# Patient Record
Sex: Female | Born: 1960 | Race: Black or African American | Hispanic: No | Marital: Single | Smoking: Never smoker
Health system: Southern US, Community
[De-identification: ages and names within clinical notes are randomized; demographics above are authoritative.]

## PROBLEM LIST (undated history)

## (undated) DIAGNOSIS — R7303 Prediabetes: Secondary | ICD-10-CM

## (undated) HISTORY — DX: Prediabetes: R73.03

---

## 2016-11-22 ENCOUNTER — Ambulatory Visit: Payer: Self-pay | Admitting: Obstetrics & Gynecology

## 2017-06-19 ENCOUNTER — Emergency Department (HOSPITAL_COMMUNITY): Payer: BLUE CROSS/BLUE SHIELD

## 2017-06-19 ENCOUNTER — Emergency Department (HOSPITAL_COMMUNITY)
Admission: EM | Admit: 2017-06-19 | Discharge: 2017-06-19 | Disposition: A | Discharge status: Home / Self Care | Payer: BLUE CROSS/BLUE SHIELD | Attending: Emergency Medicine | Admitting: Emergency Medicine

## 2017-06-19 ENCOUNTER — Encounter (HOSPITAL_COMMUNITY): Payer: Self-pay | Admitting: *Deleted

## 2017-06-19 DIAGNOSIS — T148XXA Other injury of unspecified body region, initial encounter: Secondary | ICD-10-CM | POA: Insufficient documentation

## 2017-06-19 DIAGNOSIS — Y998 Other external cause status: Secondary | ICD-10-CM | POA: Insufficient documentation

## 2017-06-19 DIAGNOSIS — M542 Cervicalgia: Secondary | ICD-10-CM | POA: Diagnosis not present

## 2017-06-19 DIAGNOSIS — M545 Low back pain: Secondary | ICD-10-CM | POA: Insufficient documentation

## 2017-06-19 DIAGNOSIS — Y9389 Activity, other specified: Secondary | ICD-10-CM | POA: Diagnosis not present

## 2017-06-19 DIAGNOSIS — R52 Pain, unspecified: Secondary | ICD-10-CM

## 2017-06-19 DIAGNOSIS — Y929 Unspecified place or not applicable: Secondary | ICD-10-CM | POA: Diagnosis not present

## 2017-06-19 DIAGNOSIS — M79602 Pain in left arm: Secondary | ICD-10-CM | POA: Diagnosis not present

## 2017-06-19 DIAGNOSIS — M25512 Pain in left shoulder: Secondary | ICD-10-CM | POA: Diagnosis present

## 2017-06-19 MED ORDER — NAPROXEN 500 MG PO TABS: 500.0000 mg | ORAL_TABLET | Freq: Two times a day (BID) | ORAL | 0 refills | Status: DC

## 2017-06-19 MED ORDER — ACETAMINOPHEN 500 MG PO TABS
1000.0000 mg | ORAL_TABLET | Freq: Once | ORAL | Status: AC
  Administered 2017-06-19: 1000 mg via ORAL
  Filled 2017-06-19: qty 2

## 2017-06-19 MED ORDER — CYCLOBENZAPRINE HCL 5 MG PO TABS: 5.0000 mg | ORAL_TABLET | Freq: Two times a day (BID) | ORAL | 0 refills | Status: DC | PRN
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
## 2017-06-20 ENCOUNTER — Encounter: Payer: Self-pay | Admitting: Family Medicine

## 2017-06-20 ENCOUNTER — Ambulatory Visit (INDEPENDENT_AMBULATORY_CARE_PROVIDER_SITE_OTHER): Payer: Self-pay | Admitting: Family Medicine

## 2017-06-20 DIAGNOSIS — M545 Low back pain, unspecified: Secondary | ICD-10-CM

## 2017-06-20 DIAGNOSIS — M62830 Muscle spasm of back: Secondary | ICD-10-CM

## 2017-06-20 MED ORDER — METHOCARBAMOL 500 MG PO TABS: 500.0000 mg | ORAL_TABLET | Freq: Three times a day (TID) | ORAL | 0 refills | Status: DC | PRN

## 2017-06-20 MED ORDER — IBUPROFEN 600 MG PO TABS: 600.0000 mg | ORAL_TABLET | Freq: Three times a day (TID) | ORAL | 0 refills | Status: DC | PRN

## 2017-06-20 MED ORDER — KETOROLAC TROMETHAMINE 60 MG/2ML IM SOLN
60.0000 mg | Freq: Once | INTRAMUSCULAR | Status: AC
Start: 2017-06-20 — End: 2017-06-20
  Administered 2017-06-20: 60 mg via INTRAMUSCULAR
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
## 2017-06-25 ENCOUNTER — Encounter (HOSPITAL_COMMUNITY): Payer: Self-pay | Admitting: Emergency Medicine

## 2017-06-25 ENCOUNTER — Emergency Department (HOSPITAL_COMMUNITY): Payer: BLUE CROSS/BLUE SHIELD

## 2017-06-25 ENCOUNTER — Emergency Department (HOSPITAL_COMMUNITY)
Admission: EM | Admit: 2017-06-25 | Discharge: 2017-06-25 | Disposition: A | Discharge status: Home / Self Care | Payer: BLUE CROSS/BLUE SHIELD | Attending: Emergency Medicine | Admitting: Emergency Medicine

## 2017-06-25 DIAGNOSIS — Y999 Unspecified external cause status: Secondary | ICD-10-CM | POA: Insufficient documentation

## 2017-06-25 DIAGNOSIS — S39012D Strain of muscle, fascia and tendon of lower back, subsequent encounter: Secondary | ICD-10-CM

## 2017-06-25 DIAGNOSIS — S3992XA Unspecified injury of lower back, initial encounter: Secondary | ICD-10-CM | POA: Diagnosis present

## 2017-06-25 DIAGNOSIS — S39012A Strain of muscle, fascia and tendon of lower back, initial encounter: Secondary | ICD-10-CM | POA: Insufficient documentation

## 2017-06-25 DIAGNOSIS — Y92410 Unspecified street and highway as the place of occurrence of the external cause: Secondary | ICD-10-CM | POA: Insufficient documentation

## 2017-06-25 DIAGNOSIS — Y939 Activity, unspecified: Secondary | ICD-10-CM | POA: Insufficient documentation

## 2017-06-25 DIAGNOSIS — R109 Unspecified abdominal pain: Secondary | ICD-10-CM | POA: Insufficient documentation

## 2017-06-25 MED ORDER — MELOXICAM 15 MG PO TABS: 15.0000 mg | ORAL_TABLET | Freq: Every day | ORAL | 0 refills | Status: DC

## 2017-06-25 NOTE — ED Notes (Signed)
Patient transported to CT 

## 2017-06-25 NOTE — Discharge Instructions (Signed)
Try heating pads, rest.  Take Mobic once a day for pain and inflammation.  See exercises below with stretches.  Do those daily.  If not improving, follow-up with family doctor or return if worsening symptoms.
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
## 2017-09-23 ENCOUNTER — Ambulatory Visit: Payer: Self-pay | Admitting: Family Medicine

## 2019-10-30 ENCOUNTER — Other Ambulatory Visit: Payer: Self-pay

## 2019-10-30 ENCOUNTER — Telehealth: Payer: Self-pay

## 2019-10-30 DIAGNOSIS — Z1231 Encounter for screening mammogram for malignant neoplasm of breast: Secondary | ICD-10-CM

## 2019-10-30 NOTE — Telephone Encounter (Signed)
Left message on voicemail requesting patient to return call to office,  returning patient's call regarding [facility] inquiry.

## 2019-10-30 NOTE — Telephone Encounter (Signed)
Patient returned call and was scheduled for 11/19/2019 with the [facility].

## 2019-11-02 ENCOUNTER — Other Ambulatory Visit: Payer: Self-pay | Admitting: Obstetrics and Gynecology

## 2019-11-02 DIAGNOSIS — Z1231 Encounter for screening mammogram for malignant neoplasm of breast: Secondary | ICD-10-CM

## 2019-11-06 ENCOUNTER — Telehealth: Payer: Self-pay

## 2019-11-06 NOTE — Telephone Encounter (Signed)
Patient left voicemail requesting mammogram appointment information. Left message on identifying voicemail-Mammogram appointment is at [facility] on 11/19/2019 @ 1:30pm.

## 2019-11-19 ENCOUNTER — Encounter (INDEPENDENT_AMBULATORY_CARE_PROVIDER_SITE_OTHER): Payer: Self-pay

## 2019-11-19 ENCOUNTER — Ambulatory Visit: Payer: Self-pay | Admitting: Student

## 2019-11-19 ENCOUNTER — Other Ambulatory Visit: Payer: Self-pay

## 2019-11-19 ENCOUNTER — Encounter: Payer: Self-pay | Admitting: Student

## 2019-11-19 ENCOUNTER — Ambulatory Visit
Admission: RE | Admit: 2019-11-19 | Discharge: 2019-11-19 | Disposition: A | Discharge status: Home / Self Care | Payer: Self-pay | Source: Ambulatory Visit | Attending: Obstetrics and Gynecology | Admitting: Obstetrics and Gynecology

## 2019-11-19 VITALS — BP 156/88 | Temp 97.7°F | Wt 184.0 lb

## 2019-11-19 DIAGNOSIS — Z1231 Encounter for screening mammogram for malignant neoplasm of breast: Secondary | ICD-10-CM

## 2019-11-19 DIAGNOSIS — Z01419 Encounter for gynecological examination (general) (routine) without abnormal findings: Secondary | ICD-10-CM

## 2019-11-19 DIAGNOSIS — N644 Mastodynia: Secondary | ICD-10-CM

## 2019-11-19 NOTE — Progress Notes (Signed)
Angela Duran is a 59 y.o. No obstetric history on file. female who presents to [facility] clinic today with complaint of left breast pain. Intermittent pain for the last week that radiates to her left axilla.    Pap Smear: Pap smear completed today. Patient thinks her last pap smear was 2 years ago but is unsure & doesn't remember where she had it done. Reports it was normal. Per patient has no history of an abnormal Pap smear. Last Pap smear result is not available in Epic.   Physical exam: Breasts Breasts symmetrical. No skin abnormalities bilateral breasts. No nipple retraction bilateral breasts. No nipple discharge bilateral breasts. No lymphadenopathy. No lumps palpated bilateral breasts.       Pelvic/Bimanual Ext Genitalia No lesions, no swelling and no discharge observed on external genitalia.        Vagina Vagina pink and normal texture. No lesions or discharge observed in vagina.        Cervix Cervix is present. Cervix pink and of normal texture. No discharge observed.    Uterus Uterus is present and palpable. Uterus in normal position and normal size.        Adnexae Bilateral ovaries present and palpable. No tenderness on palpation.         Rectovaginal No rectal exam completed today since patient had no rectal complaints. No skin abnormalities observed on exam.     Smoking History: Patient has never smoked.    Patient Navigation: Patient education provided. Access to services provided for patient through B[facility]CP program.    Colorectal Cancer Screening: Per patient has never had colonoscopy completed No complaints today.    Breast and Cervical Cancer Risk Assessment: Patient does not have family history of breast cancer, known genetic mutations, or radiation treatment to the chest before age 77. Patient does not have history of cervical dysplasia, immunocompromised, or DES exposure in-utero.  Risk Assessment    Risk Scores      11/19/2019   Last edited by: Narda Rutherford, LPN   5-year risk: 1.4 %   Lifetime risk: 7.1 %          A: B[facility]CP exam with pap smear Complaint of left breast pain  P: Referred [facility] to [facility] for a screening mammogram. Appointment scheduled today.  Angela Horn, NP 11/19/2019 11:02 AM

## 2019-11-23 ENCOUNTER — Telehealth: Payer: Self-pay

## 2019-11-23 NOTE — Telephone Encounter (Signed)
Normal pap result letter mailed.  °

## 2019-11-25 LAB — CYTOLOGY - PAP
Adequacy: ABSENT
Comment: NEGATIVE
Diagnosis: NEGATIVE
High risk HPV: NEGATIVE

## 2020-04-25 ENCOUNTER — Other Ambulatory Visit: Payer: Self-pay

## 2020-04-25 ENCOUNTER — Ambulatory Visit
Admission: EM | Admit: 2020-04-25 | Discharge: 2020-04-25 | Disposition: A | Discharge status: Home / Self Care | Payer: 59 | Attending: Physician Assistant | Admitting: Physician Assistant

## 2020-04-25 DIAGNOSIS — R059 Cough, unspecified: Secondary | ICD-10-CM

## 2020-04-25 DIAGNOSIS — R509 Fever, unspecified: Secondary | ICD-10-CM

## 2020-04-25 DIAGNOSIS — R52 Pain, unspecified: Secondary | ICD-10-CM | POA: Diagnosis not present

## 2020-04-25 DIAGNOSIS — R05 Cough: Secondary | ICD-10-CM

## 2020-04-25 DIAGNOSIS — Z1152 Encounter for screening for COVID-19: Secondary | ICD-10-CM

## 2020-04-25 MED ORDER — BENZONATATE 100 MG PO CAPS: 100.0000 mg | ORAL_CAPSULE | Freq: Three times a day (TID) | ORAL | 0 refills | Status: DC | PRN

## 2020-04-25 NOTE — ED Triage Notes (Signed)
Pt c/o cough, sore throat, nasal congestion, body aches, chills, headaches, and n/v/d since yesterday.

## 2020-04-25 NOTE — Discharge Instructions (Addendum)
Recommend start Tessalon cough pills 1 every 8 hours as needed. Continue to push fluids to keep well hydrated and to help loosen up mucus in sinuses and chest. Rest. Continue Tylenol 1000mg  and alternate with Ibuprofen 600mg  every 4 hours as needed for fever and body aches. Follow-up pending COVID 19 test results.

## 2020-04-25 NOTE — ED Provider Notes (Signed)
[name]    CSN: [id number] Arrival date & time: 04/25/20  1034      History   Chief Complaint Chief Complaint  Patient presents with   Cough    HPI Angela Duran is a 59 y.o. female.   59 year old female presents with cough, body aches, chills, headache, nasal congestion, sore throat, feeling weak with nausea, vomiting and diarrhea that started yesterday. She has taken OTC cold medication with minimal relief. No known exposure to COVID 19. Has not been vaccinated against COVID 19. Keeping a little fluids down. No other chronic health issues. Takes no daily medication.   The history is provided by the patient.    Past Medical History:  Diagnosis Date   Prediabetes     There are no problems to display for this patient.   Past Surgical History:  Procedure Laterality Date   CESAREAN SECTION      OB History   No obstetric history on file.      Home Medications    Prior to Admission medications   Medication Sig Start Date End Date Taking? Authorizing Provider  benzonatate (TESSALON) 100 MG capsule Take 1 capsule (100 mg total) by mouth 3 (three) times daily as needed for cough. 04/25/20   AmyotAli Lowe, NP    Family History Family History  Problem Relation Age of Onset   Cerebral aneurysm Mother    Hypertension Father    Arthritis Father    Heart attack Sister    Seizures Sister    Heart attack Sister    Breast cancer Cousin     Social History Social History   Tobacco Use   Smoking status: Never Smoker   Smokeless tobacco: Never Used  Building services engineer Use: Never used  Substance Use Topics   Alcohol use: No   Drug use: No     Allergies   Patient has no known allergies.   Review of Systems Review of Systems  Constitutional: Positive for activity change, appetite change, chills, fatigue and fever.  HENT: Positive for congestion, postnasal drip, rhinorrhea, sinus pressure, sinus pain and sore throat.  Negative for ear discharge, ear pain, facial swelling, mouth sores, nosebleeds and trouble swallowing.   Eyes: Negative for pain, discharge, redness and itching.  Respiratory: Positive for cough. Negative for chest tightness, shortness of breath and wheezing.   Gastrointestinal: Positive for diarrhea, nausea and vomiting.  Genitourinary: Negative for decreased urine volume, difficulty urinating and dysuria.  Musculoskeletal: Positive for arthralgias and myalgias. Negative for neck pain and neck stiffness.  Skin: Negative for color change, rash and wound.  Allergic/Immunologic: Negative for environmental allergies, food allergies and immunocompromised state.  Neurological: Positive for weakness and headaches. Negative for dizziness, seizures, syncope, facial asymmetry, light-headedness and numbness.  Hematological: Negative for adenopathy. Does not bruise/bleed easily.     Physical Exam Triage Vital Signs ED Triage Vitals  Enc Vitals Group     BP --      Pulse Rate 04/25/20 1134 100     Resp 04/25/20 1134 18     Temp 04/25/20 1134 100.2 F (37.9 C)     Temp Source 04/25/20 1134 Oral     SpO2 04/25/20 1134 94 %     Weight --      Height --      Head Circumference --      Peak Flow --      Pain Score 04/25/20 1135 8     Pain  Loc --      Pain Edu? --      Excl. in GC? --    No data found.  Updated Vital Signs Pulse 100    Temp 100.2 F (37.9 C) (Oral)    Resp 18    SpO2 94%   Visual Acuity Right Eye Distance:   Left Eye Distance:   Bilateral Distance:    Right Eye Near:   Left Eye Near:    Bilateral Near:     Physical Exam Vitals and nursing note reviewed.  Constitutional:      General: She is awake. She is not in acute distress.    Appearance: She is well-developed and well-groomed. She is ill-appearing and diaphoretic (sweating but chills).  HENT:     Head: Normocephalic and atraumatic.     Right Ear: Hearing, tympanic membrane, ear canal and external ear normal.       Left Ear: Hearing, tympanic membrane, ear canal and external ear normal.     Nose: Congestion present.     Right Sinus: Frontal sinus tenderness present. No maxillary sinus tenderness.     Left Sinus: Frontal sinus tenderness present. No maxillary sinus tenderness.     Mouth/Throat:     Lips: Pink.     Mouth: Mucous membranes are moist.     Pharynx: Oropharynx is clear. Uvula midline. Posterior oropharyngeal erythema present. No pharyngeal swelling, oropharyngeal exudate or uvula swelling.  Eyes:     Extraocular Movements: Extraocular movements intact.     Conjunctiva/sclera: Conjunctivae normal.  Cardiovascular:     Rate and Rhythm: Regular rhythm. Tachycardia present.     Heart sounds: Normal heart sounds. No murmur heard.   Pulmonary:     Effort: Pulmonary effort is normal. No tachypnea, respiratory distress or retractions.     Breath sounds: Normal breath sounds and air entry. No decreased air movement. No decreased breath sounds, wheezing, rhonchi or rales.  Musculoskeletal:        General: Normal range of motion.     Cervical back: Normal range of motion and neck supple. No rigidity.  Lymphadenopathy:     Cervical: No cervical adenopathy.  Skin:    General: Skin is warm and moist.     Capillary Refill: Capillary refill takes less than 2 seconds.     Findings: No rash.  Neurological:     General: No focal deficit present.     Mental Status: She is alert and oriented to person, place, and time.  Psychiatric:        Mood and Affect: Mood normal.        Behavior: Behavior normal. Behavior is cooperative.        Thought Content: Thought content normal.        Judgment: Judgment normal.      UC Treatments / Results  Labs (all labs ordered are listed, but only abnormal results are displayed) Labs Reviewed  NOVEL CORONAVIRUS, NAA    EKG   Radiology No results found.  Procedures Procedures (including critical care time)  Medications Ordered in UC Medications  - No data to display  Initial Impression / Assessment and Plan / UC Course  I have reviewed the triage vital signs and the nursing notes.  Pertinent labs & imaging results that were available during my care of the patient were reviewed by me and considered in my medical decision making (see chart for details).    Reviewed with patient that she probably has a viral illness- possible  COVID 19. Recommend take Tessalon cough pills 1 every 8 hours as needed. Continue to push fluids to help loosen up any mucus in sinuses and chest. Rest. Stay at home. Continue Tylenol 1000mg  and alternate with Ibuprofen 600mg  every 4 hours as needed for fever and body aches. Follow-up pending COVID 19 test results.   Final Clinical Impressions(s) / UC Diagnoses   Final diagnoses:  Encounter for screening for COVID-19  Generalized body aches  Chills with fever  Cough     Discharge Instructions     Recommend start Tessalon cough pills 1 every 8 hours as needed. Continue to push fluids to keep well hydrated and to help loosen up mucus in sinuses and chest. Rest. Continue Tylenol 1000mg  and alternate with Ibuprofen 600mg  every 4 hours as needed for fever and body aches. Follow-up pending COVID 19 test results.     ED Prescriptions    Medication Sig Dispense Auth. Provider   benzonatate (TESSALON) 100 MG capsule Take 1 capsule (100 mg total) by mouth 3 (three) times daily as needed for cough. 21 capsule Johniya Durfee, , NP     PDMP not reviewed this encounter.   , NP 04/25/20 1736

## 2020-04-27 ENCOUNTER — Telehealth: Payer: Self-pay | Admitting: Family Medicine

## 2020-04-27 LAB — SARS-COV-2, NAA 2 DAY TAT

## 2020-04-27 LAB — NOVEL CORONAVIRUS, NAA: SARS-CoV-2, NAA: NOT DETECTED

## 2020-04-27 NOTE — Telephone Encounter (Signed)
Returned call to patient and LVM to return call and schedule an appt.    Copied from CRM [id number]. Topic: General - Other >> Apr 27, 2020  8:53 AM Jaquita Rector A wrote: Reason for CRM: Patient called in to schedule an appointment with Dr Delford Field she have never seen him before here at this location. She would like a call back today if there is a way she can be seen. Complains of flu like symptoms had a covid test that came back negative but still feeling sick and would like to be seen today if possible. Please call Ph# [phone]

## 2020-04-30 ENCOUNTER — Emergency Department (HOSPITAL_COMMUNITY)
Admission: EM | Admit: 2020-04-30 | Discharge: 2020-05-01 | Disposition: A | Discharge status: Home / Self Care | Payer: 59 | Attending: Emergency Medicine | Admitting: Emergency Medicine

## 2020-04-30 ENCOUNTER — Emergency Department (HOSPITAL_COMMUNITY): Payer: 59

## 2020-04-30 ENCOUNTER — Encounter (HOSPITAL_COMMUNITY): Payer: Self-pay | Admitting: Emergency Medicine

## 2020-04-30 ENCOUNTER — Other Ambulatory Visit: Payer: Self-pay

## 2020-04-30 DIAGNOSIS — R112 Nausea with vomiting, unspecified: Secondary | ICD-10-CM | POA: Diagnosis not present

## 2020-04-30 DIAGNOSIS — U071 COVID-19: Secondary | ICD-10-CM | POA: Diagnosis not present

## 2020-04-30 DIAGNOSIS — R05 Cough: Secondary | ICD-10-CM | POA: Diagnosis present

## 2020-04-30 NOTE — ED Triage Notes (Signed)
Pt st's she has Covid and c/o shortness of breath, nausea and vomiting   St's unable to keep anything down.  Also c/o diarrhea

## 2020-05-01 LAB — SARS CORONAVIRUS 2 BY RT PCR (HOSPITAL ORDER, PERFORMED IN ~~LOC~~ HOSPITAL LAB): SARS Coronavirus 2: POSITIVE — AB

## 2020-05-01 MED ORDER — METOCLOPRAMIDE HCL 5 MG/ML IJ SOLN
10.0000 mg | Freq: Once | INTRAMUSCULAR | Status: AC
  Administered 2020-05-01: 10 mg via INTRAVENOUS
  Filled 2020-05-01: qty 2

## 2020-05-01 MED ORDER — LACTATED RINGERS IV BOLUS
1000.0000 mL | Freq: Once | INTRAVENOUS | Status: AC
  Administered 2020-05-01: 1000 mL via INTRAVENOUS

## 2020-05-01 MED ORDER — METOCLOPRAMIDE HCL 10 MG PO TABS: 10.0000 mg | ORAL_TABLET | Freq: Four times a day (QID) | ORAL | 0 refills | Status: DC | PRN

## 2020-05-01 NOTE — ED Notes (Signed)
IV attempted x2- unsuccessful. 

## 2020-05-01 NOTE — ED Provider Notes (Signed)
[facility] EMERGENCY DEPARTMENT Provider Note   CSN: [id number] Arrival date & time: 04/30/20  2112     History Chief Complaint  Patient presents with  . Covid    Angela Duran is a 59 y.o. female.   Emesis Severity:  Mild Duration:  3 days Timing:  Constant Quality:  Stomach contents Able to tolerate:  Liquids Progression:  Unchanged Chronicity:  New Recent urination:  Normal Relieved by:  Nothing Worsened by:  Nothing Ineffective treatments:  None tried Associated symptoms: abdominal pain, chills, cough, diarrhea, headaches, myalgias and sore throat        Past Medical History:  Diagnosis Date  . Prediabetes     There are no problems to display for this patient.   Past Surgical History:  Procedure Laterality Date  . CESAREAN SECTION       OB History   No obstetric history on file.     Family History  Problem Relation Age of Onset  . Cerebral aneurysm Mother   . Hypertension Father   . Arthritis Father   . Heart attack Sister   . Seizures Sister   . Heart attack Sister   . Breast cancer Cousin     Social History   Tobacco Use  . Smoking status: Never Smoker  . Smokeless tobacco: Never Used  Vaping Use  . Vaping Use: Never used  Substance Use Topics  . Alcohol use: No  . Drug use: No    Home Medications Prior to Admission medications   Medication Sig Start Date End Date Taking? Authorizing Provider  benzonatate (TESSALON) 100 MG capsule Take 1 capsule (100 mg total) by mouth 3 (three) times daily as needed for cough. 04/25/20   Sudie Grumbling, NP  metoCLOPramide (REGLAN) 10 MG tablet Take 1 tablet (10 mg total) by mouth every 6 (six) hours as needed for nausea (nausea/headache). 05/01/20   Amarra Sawyer, Barbara Cower, MD    Allergies    Patient has no known allergies.  Review of Systems   Review of Systems  Constitutional: Positive for chills.  HENT: Positive for sore throat.   Respiratory: Positive for cough.     Gastrointestinal: Positive for abdominal pain, diarrhea and vomiting.  Musculoskeletal: Positive for myalgias.  Neurological: Positive for headaches.  All other systems reviewed and are negative.   Physical Exam Updated Vital Signs BP (!) 148/93 (BP Location: Right Arm)   Pulse 98   Temp 98.1 F (36.7 C) (Oral)   Resp 16   Ht 5\' 4"  (1.626 m)   Wt 81.6 kg   SpO2 95%   BMI 30.90 kg/m   Physical Exam Vitals and nursing note reviewed.  Constitutional:      Appearance: She is well-developed.  HENT:     Head: Normocephalic and atraumatic.     Mouth/Throat:     Mouth: Mucous membranes are moist.     Pharynx: Oropharynx is clear.  Eyes:     Pupils: Pupils are equal, round, and reactive to light.  Cardiovascular:     Rate and Rhythm: Normal rate and regular rhythm.  Pulmonary:     Effort: No respiratory distress.     Breath sounds: No stridor.  Abdominal:     General: Abdomen is flat. There is no distension.  Musculoskeletal:     Cervical back: Normal range of motion.  Skin:    General: Skin is warm and dry.  Neurological:     General: No focal deficit present.  Mental Status: She is alert.     ED Results / Procedures / Treatments   Labs (all labs ordered are listed, but only abnormal results are displayed) Labs Reviewed  SARS CORONAVIRUS 2 BY RT PCR (HOSPITAL ORDER, PERFORMED IN [facility]) - Abnormal; Notable for the following components:      Result Value   SARS Coronavirus 2 POSITIVE (*)    All other components within normal limits  CBC WITH DIFFERENTIAL/PLATELET  COMPREHENSIVE METABOLIC PANEL    EKG None  Radiology DG Chest Portable 1 View  Result Date: 04/30/2020 CLINICAL DATA:  Cough. COVID positive. EXAM: PORTABLE CHEST 1 VIEW COMPARISON:  None. FINDINGS: Very mild, hazy atelectasis and/or early infiltrate is seen within the bilateral lung bases. There is no evidence of a pleural effusion or pneumothorax. The heart size and  mediastinal contours are within normal limits. The visualized skeletal structures are unremarkable. IMPRESSION: Very mild, hazy bibasilar atelectasis and/or early infiltrate. Electronically Signed   By: Aram Candela M.D.   On: 04/30/2020 22:37    Procedures Procedures (including critical care time)  Medications Ordered in ED Medications  lactated ringers bolus 1,000 mL (0 mLs Intravenous Stopped 05/01/20 0635)  metoCLOPramide (REGLAN) injection 10 mg (10 mg Intravenous Given 05/01/20 0353)    ED Course  I have reviewed the triage vital signs and the nursing notes.  Pertinent labs & imaging results that were available during my care of the patient were reviewed by me and considered in my medical decision making (see chart for details).    MDM Rules/Calculators/A&P                          COVID positive.  Mostly GI symptoms. Doesn't want MAB infusion. Symptoms improved with fluids and reglan, dc on same. Return precautions discussed.   Final Clinical Impression(s) / ED Diagnoses Final diagnoses:  COVID-19  Non-intractable vomiting with nausea, unspecified vomiting type    Rx / DC Orders ED Discharge Orders         Ordered    metoCLOPramide (REGLAN) 10 MG tablet  Every 6 hours PRN        05/01/20 0631           Atlee Kluth, Barbara Cower, MD 05/02/20 9983

## 2020-05-11 ENCOUNTER — Inpatient Hospital Stay (HOSPITAL_COMMUNITY)
Admission: EM | Admit: 2020-05-11 | Discharge: 2020-05-16 | DRG: 193 | Disposition: A | Discharge status: Home / Self Care | Payer: 59 | Attending: Internal Medicine | Admitting: Internal Medicine

## 2020-05-11 ENCOUNTER — Encounter (HOSPITAL_COMMUNITY): Payer: Self-pay

## 2020-05-11 ENCOUNTER — Emergency Department (HOSPITAL_COMMUNITY): Payer: 59

## 2020-05-11 ENCOUNTER — Other Ambulatory Visit: Payer: Self-pay

## 2020-05-11 DIAGNOSIS — J1289 Other viral pneumonia: Principal | ICD-10-CM | POA: Diagnosis present

## 2020-05-11 DIAGNOSIS — U071 COVID-19: Secondary | ICD-10-CM | POA: Diagnosis present

## 2020-05-11 DIAGNOSIS — Z9119 Patient's noncompliance with other medical treatment and regimen: Secondary | ICD-10-CM

## 2020-05-11 DIAGNOSIS — J9601 Acute respiratory failure with hypoxia: Secondary | ICD-10-CM | POA: Diagnosis not present

## 2020-05-11 DIAGNOSIS — U099 Post covid-19 condition, unspecified: Secondary | ICD-10-CM | POA: Diagnosis present

## 2020-05-11 DIAGNOSIS — Z8249 Family history of ischemic heart disease and other diseases of the circulatory system: Secondary | ICD-10-CM

## 2020-05-11 DIAGNOSIS — R7989 Other specified abnormal findings of blood chemistry: Secondary | ICD-10-CM | POA: Diagnosis present

## 2020-05-11 DIAGNOSIS — Z803 Family history of malignant neoplasm of breast: Secondary | ICD-10-CM

## 2020-05-11 DIAGNOSIS — Z8261 Family history of arthritis: Secondary | ICD-10-CM

## 2020-05-11 DIAGNOSIS — R7401 Elevation of levels of liver transaminase levels: Secondary | ICD-10-CM | POA: Diagnosis present

## 2020-05-11 DIAGNOSIS — Z20822 Contact with and (suspected) exposure to covid-19: Secondary | ICD-10-CM | POA: Diagnosis present

## 2020-05-11 DIAGNOSIS — J1282 Pneumonia due to coronavirus disease 2019: Secondary | ICD-10-CM | POA: Diagnosis present

## 2020-05-11 DIAGNOSIS — R7303 Prediabetes: Secondary | ICD-10-CM | POA: Diagnosis present

## 2020-05-11 DIAGNOSIS — I10 Essential (primary) hypertension: Secondary | ICD-10-CM | POA: Diagnosis present

## 2020-05-11 NOTE — ED Triage Notes (Signed)
Patient had covid several weeks ago and today when she went to MD for further evaluation they noted sats remaining 90 and below. Patient complains of CP intermittently and had negative retest today for Covid, complains of exertional SOB. sats 99% room air on arrival

## 2020-05-11 NOTE — ED Notes (Signed)
Pt asked for oxygen. RN was notified and confirmed to give pt oxygen

## 2020-05-12 ENCOUNTER — Emergency Department (HOSPITAL_COMMUNITY): Payer: 59

## 2020-05-12 DIAGNOSIS — R7989 Other specified abnormal findings of blood chemistry: Secondary | ICD-10-CM | POA: Diagnosis present

## 2020-05-12 DIAGNOSIS — J1282 Pneumonia due to coronavirus disease 2019: Secondary | ICD-10-CM | POA: Diagnosis not present

## 2020-05-12 DIAGNOSIS — I10 Essential (primary) hypertension: Secondary | ICD-10-CM | POA: Diagnosis present

## 2020-05-12 DIAGNOSIS — J1289 Other viral pneumonia: Secondary | ICD-10-CM | POA: Diagnosis present

## 2020-05-12 DIAGNOSIS — Z20822 Contact with and (suspected) exposure to covid-19: Secondary | ICD-10-CM | POA: Diagnosis present

## 2020-05-12 DIAGNOSIS — Z9119 Patient's noncompliance with other medical treatment and regimen: Secondary | ICD-10-CM | POA: Diagnosis not present

## 2020-05-12 DIAGNOSIS — U071 COVID-19: Secondary | ICD-10-CM | POA: Diagnosis present

## 2020-05-12 DIAGNOSIS — U099 Post covid-19 condition, unspecified: Secondary | ICD-10-CM | POA: Diagnosis present

## 2020-05-12 DIAGNOSIS — J9601 Acute respiratory failure with hypoxia: Secondary | ICD-10-CM | POA: Diagnosis present

## 2020-05-12 DIAGNOSIS — Z803 Family history of malignant neoplasm of breast: Secondary | ICD-10-CM | POA: Diagnosis not present

## 2020-05-12 DIAGNOSIS — R7401 Elevation of levels of liver transaminase levels: Secondary | ICD-10-CM | POA: Diagnosis present

## 2020-05-12 DIAGNOSIS — Z8249 Family history of ischemic heart disease and other diseases of the circulatory system: Secondary | ICD-10-CM | POA: Diagnosis not present

## 2020-05-12 DIAGNOSIS — Z8261 Family history of arthritis: Secondary | ICD-10-CM | POA: Diagnosis not present

## 2020-05-12 DIAGNOSIS — R7303 Prediabetes: Secondary | ICD-10-CM | POA: Diagnosis present

## 2020-05-12 LAB — CBC
HCT: 37.7 % (ref 36.0–46.0)
Hemoglobin: 11.4 g/dL — ABNORMAL LOW (ref 12.0–15.0)
MCH: 26.3 pg (ref 26.0–34.0)
MCHC: 30.2 g/dL (ref 30.0–36.0)
MCV: 86.9 fL (ref 80.0–100.0)
Platelets: 251 10*3/uL (ref 150–400)
RBC: 4.34 MIL/uL (ref 3.87–5.11)
RDW: 12.3 % (ref 11.5–15.5)
WBC: 8.7 10*3/uL (ref 4.0–10.5)
nRBC: 0 % (ref 0.0–0.2)

## 2020-05-12 LAB — COMPREHENSIVE METABOLIC PANEL
ALT: 66 U/L — ABNORMAL HIGH (ref 0–44)
AST: 53 U/L — ABNORMAL HIGH (ref 15–41)
Albumin: 2.8 g/dL — ABNORMAL LOW (ref 3.5–5.0)
Alkaline Phosphatase: 94 U/L (ref 38–126)
Anion gap: 11 (ref 5–15)
BUN: 9 mg/dL (ref 6–20)
CO2: 25 mmol/L (ref 22–32)
Calcium: 8.8 mg/dL — ABNORMAL LOW (ref 8.9–10.3)
Chloride: 102 mmol/L (ref 98–111)
Creatinine, Ser: 0.64 mg/dL (ref 0.44–1.00)
GFR calc Af Amer: >60 mL/min (ref >60)
GFR calc non Af Amer: >60 mL/min (ref >60)
Glucose, Bld: 96 mg/dL (ref 70–99)
Potassium: 3.7 mmol/L (ref 3.5–5.1)
Sodium: 138 mmol/L (ref 135–145)
Total Bilirubin: 0.9 mg/dL (ref 0.3–1.2)
Total Protein: 7.5 g/dL (ref 6.5–8.1)

## 2020-05-12 LAB — TRIGLYCERIDES: Triglycerides: 262 mg/dL — ABNORMAL HIGH (ref <150)

## 2020-05-12 LAB — LACTATE DEHYDROGENASE: LDH: 376 U/L — ABNORMAL HIGH (ref 98–192)

## 2020-05-12 LAB — PROCALCITONIN: Procalcitonin: 0.14 ng/mL

## 2020-05-12 LAB — TROPONIN I (HIGH SENSITIVITY)
Troponin I (High Sensitivity): 33 ng/L — ABNORMAL HIGH (ref <18)
Troponin I (High Sensitivity): 27 ng/L — ABNORMAL HIGH (ref <18)

## 2020-05-12 LAB — D-DIMER, QUANTITATIVE: D-Dimer, Quant: 14.68 ug/mL-FEU — ABNORMAL HIGH (ref 0.00–0.50)

## 2020-05-12 LAB — FERRITIN: Ferritin: 788 ng/mL — ABNORMAL HIGH (ref 11–307)

## 2020-05-12 LAB — BRAIN NATRIURETIC PEPTIDE: B Natriuretic Peptide: 36.5 pg/mL (ref 0.0–100.0)

## 2020-05-12 LAB — C-REACTIVE PROTEIN: CRP: 2.4 mg/dL — ABNORMAL HIGH (ref <1.0)

## 2020-05-12 LAB — FIBRINOGEN: Fibrinogen: 408 mg/dL (ref 210–475)

## 2020-05-12 LAB — LACTIC ACID, PLASMA: Lactic Acid, Venous: 1.3 mmol/L (ref 0.5–1.9)

## 2020-05-12 MED ORDER — ZINC SULFATE 220 (50 ZN) MG PO CAPS
220.0000 mg | ORAL_CAPSULE | Freq: Every day | ORAL | Status: DC
  Administered 2020-05-12 – 2020-05-16 (×5): 220 mg via ORAL
  Filled 2020-05-12 (×5): qty 1

## 2020-05-12 MED ORDER — IOHEXOL 350 MG/ML SOLN
100.0000 mL | Freq: Once | INTRAVENOUS | Status: AC | PRN
  Administered 2020-05-12: 60 mL via INTRAVENOUS

## 2020-05-12 MED ORDER — METHYLPREDNISOLONE SODIUM SUCC 125 MG IJ SOLR
1.0000 mg/kg | Freq: Two times a day (BID) | INTRAMUSCULAR | Status: DC
  Administered 2020-05-12 – 2020-05-13 (×2): 81.875 mg via INTRAVENOUS
  Filled 2020-05-12 (×2): qty 2

## 2020-05-12 MED ORDER — ONDANSETRON HCL 4 MG/2ML IJ SOLN: 4.0000 mg | Freq: Four times a day (QID) | INTRAMUSCULAR | Status: DC | PRN

## 2020-05-12 MED ORDER — ONDANSETRON HCL 4 MG PO TABS: 4.0000 mg | ORAL_TABLET | Freq: Four times a day (QID) | ORAL | Status: DC | PRN

## 2020-05-12 MED ORDER — ENOXAPARIN SODIUM 40 MG/0.4ML ~~LOC~~ SOLN
40.0000 mg | SUBCUTANEOUS | Status: DC
  Administered 2020-05-12: 40 mg via SUBCUTANEOUS
  Filled 2020-05-12: qty 0.4

## 2020-05-12 MED ORDER — AEROCHAMBER PLUS FLO-VU LARGE MISC
Status: AC
  Administered 2020-05-12: 1
  Filled 2020-05-12: qty 1

## 2020-05-12 MED ORDER — GUAIFENESIN-DM 100-10 MG/5ML PO SYRP: 10.0000 mL | ORAL_SOLUTION | ORAL | Status: DC | PRN

## 2020-05-12 MED ORDER — ACETAMINOPHEN 325 MG PO TABS: 650.0000 mg | ORAL_TABLET | Freq: Four times a day (QID) | ORAL | Status: DC | PRN

## 2020-05-12 MED ORDER — ASCORBIC ACID 500 MG PO TABS
500.0000 mg | ORAL_TABLET | Freq: Every day | ORAL | Status: DC
  Administered 2020-05-12 – 2020-05-16 (×5): 500 mg via ORAL
  Filled 2020-05-12 (×5): qty 1

## 2020-05-12 MED ORDER — SODIUM CHLORIDE 0.9 % IV SOLN
100.0000 mg | INTRAVENOUS | Status: AC
  Administered 2020-05-12: 100 mg via INTRAVENOUS
  Filled 2020-05-12 (×2): qty 20

## 2020-05-12 MED ORDER — DEXAMETHASONE SODIUM PHOSPHATE 10 MG/ML IJ SOLN
10.0000 mg | Freq: Once | INTRAMUSCULAR | Status: AC
  Administered 2020-05-12: 10 mg via INTRAVENOUS
  Filled 2020-05-12: qty 1

## 2020-05-12 MED ORDER — SODIUM CHLORIDE 0.9 % IV SOLN: 100.0000 mg | Freq: Every day | INTRAVENOUS | Status: DC

## 2020-05-12 MED ORDER — SODIUM CHLORIDE 0.9 % IV SOLN: 200.0000 mg | Freq: Once | INTRAVENOUS | Status: DC

## 2020-05-12 MED ORDER — SODIUM CHLORIDE 0.9 % IV SOLN
100.0000 mg | Freq: Every day | INTRAVENOUS | Status: DC
  Administered 2020-05-12: 100 mg via INTRAVENOUS
  Filled 2020-05-12: qty 20

## 2020-05-12 MED ORDER — PREDNISONE 20 MG PO TABS: 50.0000 mg | ORAL_TABLET | Freq: Every day | ORAL | Status: DC

## 2020-05-12 MED ORDER — ALBUTEROL SULFATE HFA 108 (90 BASE) MCG/ACT IN AERS
2.0000 | INHALATION_SPRAY | Freq: Once | RESPIRATORY_TRACT | Status: AC
  Administered 2020-05-12: 2 via RESPIRATORY_TRACT
  Filled 2020-05-12: qty 6.7

## 2020-05-12 MED ORDER — HYDROCOD POLST-CPM POLST ER 10-8 MG/5ML PO SUER: 5.0000 mL | Freq: Two times a day (BID) | ORAL | Status: DC | PRN

## 2020-05-12 NOTE — ED Provider Notes (Signed)
[facility] EMERGENCY DEPARTMENT Provider Note   CSN: [id number] Arrival date & time: 05/11/20  1332     History Chief Complaint  Patient presents with  . covid-, from md office    Angela Duran is a 59 y.o. female.  HPI   PT is a 59 y/o female who presents to the ED today for eval of low oxygen. States she was diagnosed with COVID 9/15. She started feeling SOB 9/17. States she was at there PCP office yesterday for COVID recheck and per the note from the office which the patient has at bedside her sats would not go above 90% on RA. Pt states she believes that her oxygen was in the 80s. She was sent here for further eval. States SOB has worsened in recent days and that she gets SOB even with her daily activities. Denies any persistent CP. She states she only has CP when she tries to take a deep breath. Denies cough, hemoptysis, BLE swelling. Denies persistent fevers/mylagias or other systemic complaints.  Past Medical History:  Diagnosis Date  . Prediabetes     Patient Active Problem List   Diagnosis Date Noted  . Pneumonia due to COVID-19 virus 05/12/2020  . Elevated LFTs 05/12/2020  . Elevated d-dimer 05/12/2020    Past Surgical History:  Procedure Laterality Date  . CESAREAN SECTION       OB History   No obstetric history on file.     Family History  Problem Relation Age of Onset  . Cerebral aneurysm Mother   . Hypertension Father   . Arthritis Father   . Heart attack Sister   . Seizures Sister   . Heart attack Sister   . Breast cancer Cousin     Social History   Tobacco Use  . Smoking status: Never Smoker  . Smokeless tobacco: Never Used  Vaping Use  . Vaping Use: Never used  Substance Use Topics  . Alcohol use: No  . Drug use: No    Home Medications Prior to Admission medications   Medication Sig Start Date End Date Taking? Authorizing Provider  benzonatate (TESSALON) 100 MG capsule Take 1 capsule (100 mg total) by mouth 3  (three) times daily as needed for cough. Patient not taking: Reported on 05/12/2020 04/25/20   Sudie Grumbling, NP  metoCLOPramide (REGLAN) 10 MG tablet Take 1 tablet (10 mg total) by mouth every 6 (six) hours as needed for nausea (nausea/headache). Patient not taking: Reported on 05/12/2020 05/01/20   Mesner, Barbara Cower, MD    Allergies    Patient has no known allergies.  Review of Systems   Review of Systems  Constitutional: Negative for chills and fever.  HENT: Negative for ear pain and sore throat.   Eyes: Negative for visual disturbance.  Respiratory: Positive for shortness of breath. Negative for cough.   Cardiovascular: Positive for chest pain (intermittent). Negative for palpitations and leg swelling.  Gastrointestinal: Negative for abdominal pain, constipation, diarrhea, nausea and vomiting.  Genitourinary: Negative for dysuria and hematuria.  Musculoskeletal: Negative for back pain.  Skin: Negative for rash.  Neurological: Negative for seizures and syncope.  All other systems reviewed and are negative.   Physical Exam Updated Vital Signs BP (!) 129/91   Pulse (!) 101   Temp 98.8 F (37.1 C) (Oral)   Resp (!) 27   Ht 5\' 4"  (1.626 m)   Wt 81.6 kg   SpO2 93%   BMI 30.90 kg/m   Physical Exam Vitals and  nursing note reviewed.  Constitutional:      General: She is not in acute distress.    Appearance: She is well-developed.  HENT:     Head: Normocephalic and atraumatic.  Eyes:     Conjunctiva/sclera: Conjunctivae normal.  Cardiovascular:     Rate and Rhythm: Normal rate and regular rhythm.     Pulses: Normal pulses.     Heart sounds: No murmur heard.   Pulmonary:     Effort: Pulmonary effort is normal. No respiratory distress.     Breath sounds: Examination of the right-lower field reveals rales. Examination of the left-lower field reveals rales. Rales present.  Abdominal:     General: Bowel sounds are normal.     Palpations: Abdomen is soft.     Tenderness: There  is no abdominal tenderness. There is no guarding or rebound.  Musculoskeletal:     Cervical back: Neck supple.  Skin:    General: Skin is warm and dry.  Neurological:     Mental Status: She is alert.     ED Results / Procedures / Treatments   Labs (all labs ordered are listed, but only abnormal results are displayed) Labs Reviewed  COMPREHENSIVE METABOLIC PANEL - Abnormal; Notable for the following components:      Result Value   Calcium 8.8 (*)    Albumin 2.8 (*)    AST 53 (*)    ALT 66 (*)    All other components within normal limits  D-DIMER, QUANTITATIVE (NOT AT [facility]) - Abnormal; Notable for the following components:   D-Dimer, Quant 14.68 (*)    All other components within normal limits  LACTATE DEHYDROGENASE - Abnormal; Notable for the following components:   LDH 376 (*)    All other components within normal limits  FERRITIN - Abnormal; Notable for the following components:   Ferritin 788 (*)    All other components within normal limits  C-REACTIVE PROTEIN - Abnormal; Notable for the following components:   CRP 2.4 (*)    All other components within normal limits  TRIGLYCERIDES - Abnormal; Notable for the following components:   Triglycerides 262 (*)    All other components within normal limits  CBC - Abnormal; Notable for the following components:   Hemoglobin 11.4 (*)    All other components within normal limits  TROPONIN I (HIGH SENSITIVITY) - Abnormal; Notable for the following components:   Troponin I (High Sensitivity) 33 (*)    All other components within normal limits  TROPONIN I (HIGH SENSITIVITY) - Abnormal; Notable for the following components:   Troponin I (High Sensitivity) 27 (*)    All other components within normal limits  CULTURE, BLOOD (ROUTINE X 2)  CULTURE, BLOOD (ROUTINE X 2)  LACTIC ACID, PLASMA  PROCALCITONIN  FIBRINOGEN  HIV ANTIBODY (ROUTINE TESTING W REFLEX)  CREATININE, SERUM  HEPATITIS B SURFACE ANTIGEN  BRAIN NATRIURETIC PEPTIDE   I-STAT BETA HCG BLOOD, ED ([facility], [facility], [facility] ONLY)    EKG None  EKG with heart rate 112, sinus tach, left axis deviation, inferior infarct, age undetermined, anterior infarct, age undetermined  Radiology DG Chest 1 View  Result Date: 05/11/2020 CLINICAL DATA:  Decreased oxygen saturation EXAM: CHEST  1 VIEW COMPARISON:  April 30, 2020 FINDINGS: Ill-defined airspace opacity is noted throughout the lungs bilaterally, increased from recent study. Heart size and pulmonary vascularity are normal. No adenopathy. No bone lesions. IMPRESSION: Multifocal airspace opacity, increased from most recent study. Suspect atypical organism pneumonia. Cardiac silhouette normal. No  adenopathy. Electronically Signed   By: Bretta Bang III M.D.   On: 05/11/2020 16:09   CT Angio Chest PE W and/or Wo Contrast  Result Date: 05/12/2020 CLINICAL DATA:  Recent COVID.  Hypoxia, high probability PE EXAM: CT ANGIOGRAPHY CHEST WITH CONTRAST TECHNIQUE: Multidetector CT imaging of the chest was performed using the standard protocol during bolus administration of intravenous contrast. Multiplanar CT image reconstructions and MIPs were obtained to evaluate the vascular anatomy. CONTRAST:  59mL OMNIPAQUE IOHEXOL 350 MG/ML SOLN COMPARISON:  CT 06/25/2017 FINDINGS: Cardiovascular: Heart size normal. No pericardial effusion. Satisfactory opacification of pulmonary arteries noted, and there is no evidence of pulmonary emboli. Adequate contrast opacification of the thoracic aorta with no evidence of dissection, aneurysm, or stenosis. There is classic 3-vessel brachiocephalic arch anatomy without proximal stenosis. Mediastinum/Nodes: No hilar or mediastinal adenopathy. Lungs/Pleura: No pleural effusion. No pneumothorax. Coarse peripheral airspace opacities throughout both lungs with some associated interstitial thickening, new since previous. Upper Abdomen: No acute findings Musculoskeletal: No chest wall abnormality. No acute or  significant osseous findings. Review of the MIP images confirms the above findings. IMPRESSION: 1. Negative for acute PE or thoracic aortic dissection. 2. Extensive pulmonary parenchymal changes consistent with organizing pneumonia, likely subacute COVID. Electronically Signed   By: Corlis Leak M.D.   On: 05/12/2020 15:58    Procedures Procedures (including critical care time)  Medications Ordered in ED Medications  enoxaparin (LOVENOX) injection 40 mg (has no administration in time range)  methylPREDNISolone sodium succinate (SOLU-MEDROL) 125 mg/2 mL injection 81.875 mg (has no administration in time range)    Followed by  predniSONE (DELTASONE) tablet 50 mg (has no administration in time range)  guaiFENesin-dextromethorphan (ROBITUSSIN DM) 100-10 MG/5ML syrup 10 mL (has no administration in time range)  chlorpheniramine-HYDROcodone (TUSSIONEX) 10-8 MG/5ML suspension 5 mL (has no administration in time range)  ascorbic acid (VITAMIN C) tablet 500 mg (has no administration in time range)  zinc sulfate capsule 220 mg (has no administration in time range)  acetaminophen (TYLENOL) tablet 650 mg (has no administration in time range)  ondansetron (ZOFRAN) tablet 4 mg (has no administration in time range)    Or  ondansetron (ZOFRAN) injection 4 mg (has no administration in time range)  remdesivir 100 mg in sodium chloride 0.9 % 100 mL IVPB (has no administration in time range)    Followed by  remdesivir 100 mg in sodium chloride 0.9 % 100 mL IVPB (has no administration in time range)  albuterol (VENTOLIN HFA) 108 (90 Base) MCG/ACT inhaler 2 puff (2 puffs Inhalation Given 05/12/20 1236)  dexamethasone (DECADRON) injection 10 mg (10 mg Intravenous Given 05/12/20 1234)  AeroChamber Plus Flo-Vu Large MISC (1 each  Given 05/12/20 1235)  iohexol (OMNIPAQUE) 350 MG/ML injection 100 mL (60 mLs Intravenous Contrast Given 05/12/20 1513)    ED Course  I have reviewed the triage vital signs and the nursing  notes.  Pertinent labs & imaging results that were available during my care of the patient were reviewed by me and considered in my medical decision making (see chart for details).    MDM Rules/Calculators/A&P                          59 year old female with recent history of Covid presenting for evaluation of hypoxia that was noted at her PCP office prior to arrival.  Patient satting in low 90s today at rest and placed on 2 L.  Nursing ambulated pt and she dropped  briefly in to the 80s. I also personally ambulated the patient and she became dyspneic to the point that she had to sit down on the bed. Her sats then dropped as low at 79% on RA with a perfect waveform.   Chest x-ray reviewed/interpreted-  Multifocal airspace opacity, increased from most recent study. Suspect atypical organism pneumonia. Cardiac silhouette normal. No adenopathy  Reviewed/interpreted labs CBC was cancelled by lab yesterday, pending on admission CMP with marginally elevated liver enzymes, low albumin at 2.8 Inflammatory markers elevated Procalcitonin negative making bacterial infection less likely Beta-hCG negative Lactic acid negative Blood cultures obtained  EKG with sinus tach, left axis deviation and anterior/anterior infarct that are age-indeterminate  CXR reviewed/interpreted -  Multifocal airspace opacity, increased from most recent study. Suspect atypical organism pneumonia. Cardiac silhouette normal. No adenopathy.  CTA chest reviewed/interpreted  - 1. Negative for acute PE or thoracic aortic dissection. 2. Extensive pulmonary parenchymal changes consistent with organizing pneumonia, likely subacute COVID.  4:22 PM CONSULT with Dr. Rulon AbidePawahni who accepts patient for admission.    Final Clinical Impression(s) / ED Diagnoses Final diagnoses:  Acute respiratory failure with hypoxia [facility](HCC)    Rx / DC Orders ED Discharge Orders    None       Karrie MeresCouture, Tira Lafferty S, PA-C 05/12/20 1657    Bethann BerkshireZammit,  Joseph, MD 05/15/20 [phone]

## 2020-05-12 NOTE — ED Notes (Signed)
Pt O2 dropped to 86% when ambulating in room. RR 32-38.  Placed on 2L O2. Current Spo2 95%.

## 2020-05-12 NOTE — ED Notes (Signed)
Pt refused bloodwork no con plaints noted at this time

## 2020-05-12 NOTE — ED Notes (Signed)
Pt states she is a hard stick, the IV team had to get her IV in her left [facility] because the right AC was no good. Pt has good veins in her hands but does not want to be stuck there. Pt states "can we just let the nurse draw it from the IV"? Tech told the pt she would inform the nurse

## 2020-05-12 NOTE — ED Notes (Signed)
Pt taken off O2. Currently at 95% spo2 at rest.

## 2020-05-12 NOTE — ED Notes (Addendum)
Pt refusing blood work. States " I had a bad experience in the past, they couldn't find my veins and kept sticking me. I told the phlebotomist here they had one try and they didn't get it so I don't want to be stuck again. No blood work."

## 2020-05-12 NOTE — ED Notes (Signed)
Pt states she doest need oxygen anymore

## 2020-05-12 NOTE — H&P (Signed)
History and Physical    Angela Duran RUE:[id number] DOB: 28-Aug-1960 DOA: 05/11/2020  PCP: Patient, No Pcp Per  Patient coming from: Home  I have personally briefly reviewed patient's old medical records in [facility]  Chief Complaint: Hypoxia, COVID-19 positive on 9/18  HPI: Angela Duran is a 59 y.o. female with no significant past medical history presents to emergency department for evaluation of low oxygen saturation.  Patient tells me that she diagnosed with COVID-19 on 9/18.  She went to urgent care yesterday for retesting of COVID-19 where she was noted to be hypoxic and was recommended to go to ED for further evaluation and management.  She did tested negative for COVID-19 yesterday.  She continues to have shortness of breath even at rest, has pleuritic chest pain as well.  She denies wheezing, fever, chills, nausea, vomiting, diarrhea, leg swelling, recent travel, orthopnea, PND, abdominal pain, urinary or bowel changes.  Reports some weakness and fatigue.  She does not have any medical problems and does not take any medications at home.  Denies smoking, alcohol, illicit drug use.  She is not vaccinated against COVID-19.  ED Course: Upon arrival to ED: Patient tachycardic, tachypneic, oxygen saturation drops when she walks.  She is afebrile.  CBC is pending.  Lactic acid: WNL, CMP shows AST of 53, ALT: 66, D-dimer: 14, procalcitonin: 0.14, CRP: 2.4, troponin, blood culture: Pending.  CT angio of chest came back negative for PE.  Shows extensive pulmonary parenchymal changes consistent with organizing pneumonia likely subacute Covid.  Patient received Decadron in ED.  [facility] hospitalist consulted for admission for Covid pneumonia.  Review of Systems: As per HPI otherwise negative.    Past Medical History:  Diagnosis Date  . Prediabetes     Past Surgical History:  Procedure Laterality Date  . CESAREAN SECTION       reports that she has never smoked. She has never  used smokeless tobacco. She reports that she does not drink alcohol and does not use drugs.  No Known Allergies  Family History  Problem Relation Age of Onset  . Cerebral aneurysm Mother   . Hypertension Father   . Arthritis Father   . Heart attack Sister   . Seizures Sister   . Heart attack Sister   . Breast cancer Cousin     Prior to Admission medications   Medication Sig Start Date End Date Taking? Authorizing Provider  benzonatate (TESSALON) 100 MG capsule Take 1 capsule (100 mg total) by mouth 3 (three) times daily as needed for cough. Patient not taking: Reported on 05/12/2020 04/25/20   Sudie Grumbling, NP  metoCLOPramide (REGLAN) 10 MG tablet Take 1 tablet (10 mg total) by mouth every 6 (six) hours as needed for nausea (nausea/headache). Patient not taking: Reported on 05/12/2020 05/01/20   Marily Memos, MD    Physical Exam: Vitals:   05/12/20 1330 05/12/20 1345 05/12/20 1400 05/12/20 1415  BP: (!) 155/87 (!) 146/87 (!) 154/90 (!) 129/91  Pulse: 94 97 98 99  Resp: (!) 27 (!) 24 20 (!) 27  Temp:      TempSrc:      SpO2: 92% 90% 93% 92%  Weight:      Height:        Constitutional: NAD, calm, comfortable, on room air, communicating well Eyes: PERRL, lids and conjunctivae normal ENMT: Mucous membranes are moist. Posterior pharynx clear of any exudate or lesions.Normal dentition.  Neck: normal, supple, no masses, no thyromegaly Respiratory: clear to auscultation  bilaterally, no wheezing, no crackles. Normal respiratory effort. No accessory muscle use.  Cardiovascular: Regular rate and rhythm, no murmurs / rubs / gallops. No extremity edema. 2+ pedal pulses. No carotid bruits.  Abdomen: no tenderness, no masses palpated. No hepatosplenomegaly. Bowel sounds positive.  Musculoskeletal: no clubbing / cyanosis. No joint deformity upper and lower extremities. Good ROM, no contractures. Normal muscle tone.  Skin: no rashes, lesions, ulcers. No induration Neurologic: CN 2-12  grossly intact. Sensation intact, DTR normal. Strength 5/5 in all 4.  Psychiatric: Normal judgment and insight. Alert and oriented x 3. Normal mood.    Labs on Admission: I have personally reviewed following labs and imaging studies  CBC: No results for input(s): WBC, NEUTROABS, HGB, HCT, MCV, PLT in the last 168 hours. Basic Metabolic Panel: Recent Labs  Lab 05/12/20 1230  NA 138  K 3.7  CL 102  CO2 25  GLUCOSE 96  BUN 9  CREATININE 0.64  CALCIUM 8.8*   GFR: Estimated Creatinine Clearance: 78.3 mL/min (by C-G formula based on SCr of 0.64 mg/dL). Liver Function Tests: Recent Labs  Lab 05/12/20 1230  AST 53*  ALT 66*  ALKPHOS 94  BILITOT 0.9  PROT 7.5  ALBUMIN 2.8*   No results for input(s): LIPASE, AMYLASE in the last 168 hours. No results for input(s): AMMONIA in the last 168 hours. Coagulation Profile: No results for input(s): INR, PROTIME in the last 168 hours. Cardiac Enzymes: No results for input(s): CKTOTAL, CKMB, CKMBINDEX, TROPONINI in the last 168 hours. BNP (last 3 results) No results for input(s): PROBNP in the last 8760 hours. HbA1C: No results for input(s): HGBA1C in the last 72 hours. CBG: No results for input(s): GLUCAP in the last 168 hours. Lipid Profile: Recent Labs    05/12/20 1230  TRIG 262*   Thyroid Function Tests: No results for input(s): TSH, T4TOTAL, FREET4, T3FREE, THYROIDAB in the last 72 hours. Anemia Panel: Recent Labs    05/12/20 1230  FERRITIN 788*   Urine analysis: No results found for: COLORURINE, APPEARANCEUR, LABSPEC, PHURINE, GLUCOSEU, HGBUR, BILIRUBINUR, KETONESUR, PROTEINUR, UROBILINOGEN, NITRITE, LEUKOCYTESUR  Radiological Exams on Admission: DG Chest 1 View  Result Date: 05/11/2020 CLINICAL DATA:  Decreased oxygen saturation EXAM: CHEST  1 VIEW COMPARISON:  April 30, 2020 FINDINGS: Ill-defined airspace opacity is noted throughout the lungs bilaterally, increased from recent study. Heart size and pulmonary  vascularity are normal. No adenopathy. No bone lesions. IMPRESSION: Multifocal airspace opacity, increased from most recent study. Suspect atypical organism pneumonia. Cardiac silhouette normal. No adenopathy. Electronically Signed   By: Bretta Bang III M.D.   On: 05/11/2020 16:09   CT Angio Chest PE W and/or Wo Contrast  Result Date: 05/12/2020 CLINICAL DATA:  Recent COVID.  Hypoxia, high probability PE EXAM: CT ANGIOGRAPHY CHEST WITH CONTRAST TECHNIQUE: Multidetector CT imaging of the chest was performed using the standard protocol during bolus administration of intravenous contrast. Multiplanar CT image reconstructions and MIPs were obtained to evaluate the vascular anatomy. CONTRAST:  64mL OMNIPAQUE IOHEXOL 350 MG/ML SOLN COMPARISON:  CT 06/25/2017 FINDINGS: Cardiovascular: Heart size normal. No pericardial effusion. Satisfactory opacification of pulmonary arteries noted, and there is no evidence of pulmonary emboli. Adequate contrast opacification of the thoracic aorta with no evidence of dissection, aneurysm, or stenosis. There is classic 3-vessel brachiocephalic arch anatomy without proximal stenosis. Mediastinum/Nodes: No hilar or mediastinal adenopathy. Lungs/Pleura: No pleural effusion. No pneumothorax. Coarse peripheral airspace opacities throughout both lungs with some associated interstitial thickening, new since previous. Upper Abdomen: No  acute findings Musculoskeletal: No chest wall abnormality. No acute or significant osseous findings. Review of the MIP images confirms the above findings. IMPRESSION: 1. Negative for acute PE or thoracic aortic dissection. 2. Extensive pulmonary parenchymal changes consistent with organizing pneumonia, likely subacute COVID. Electronically Signed   By: Corlis Leak M.D.   On: 05/12/2020 15:58    EKG: Independently reviewed.  Sinus tachycardia, left axis deviation.  Inferior & anterior infarct, age undetermined. Assessment/Plan Principal Problem:    Pneumonia due to COVID-19 virus Active Problems:   Elevated LFTs   Elevated d-dimer    Pneumonia due to COVID-19 virus: -Patient presented for tachycardia, tachypnea, maintaining oxygen saturation on room air however her oxygen saturation drops when she walks.  She is afebrile, lactic acid: WNL.  CT angio negative for PE. -Patient received Decadron once in ED. -D-dimer: 14, PCT: 0.14, CRP: 2.4, all inflammatory markers: Elevated. -Admit patient on the floor.  On continuous pulse ox. -Start patient on remdesivir, Solu-Medrol -Repeat inflammatory markers tomorrow AM.  Start patient on p.o. vitamins and antitussive. -Patient was told that if COVID-19 pneumonitis gets worse we potentially might use baricitinib-she denies known history of hepatitis or tuberculosis and wants to proceed with baricitinib if necessary. -Monitor vitals closely.  Elevated LFT: Likely in the setting of COVID-19 infection. -Repeat CMP tomorrow a.m.  Elevated D-dimer: 14.  CT angio negative for PE.  Will get Doppler ultrasound of bilateral lower extremities to rule out DVT.  DVT prophylaxis: Lovenox/SCD Code Status: Full code Family Communication: None present at bedside.  Plan of care discussed with patient in length and he verbalized understanding and agreed with it. Disposition Plan: Home in 3 to 4 days Consults called: None Admission status: Inpatient   Ollen Bowl MD [facility]  If 7PM-7AM, please contact night-coverage [email or web address] Password [facility]  05/12/2020, 4:24 PM

## 2020-05-12 NOTE — ED Notes (Signed)
Patient transported to CT 

## 2020-05-13 ENCOUNTER — Other Ambulatory Visit: Payer: Self-pay

## 2020-05-13 ENCOUNTER — Inpatient Hospital Stay (HOSPITAL_COMMUNITY): Payer: 59

## 2020-05-13 DIAGNOSIS — R7989 Other specified abnormal findings of blood chemistry: Secondary | ICD-10-CM

## 2020-05-13 DIAGNOSIS — U071 COVID-19: Secondary | ICD-10-CM

## 2020-05-13 LAB — COMPREHENSIVE METABOLIC PANEL
ALT: 67 U/L — ABNORMAL HIGH (ref 0–44)
AST: 53 U/L — ABNORMAL HIGH (ref 15–41)
Albumin: 2.8 g/dL — ABNORMAL LOW (ref 3.5–5.0)
Alkaline Phosphatase: 87 U/L (ref 38–126)
Anion gap: 12 (ref 5–15)
BUN: 17 mg/dL (ref 6–20)
CO2: 21 mmol/L — ABNORMAL LOW (ref 22–32)
Calcium: 8.9 mg/dL (ref 8.9–10.3)
Chloride: 108 mmol/L (ref 98–111)
Creatinine, Ser: 0.75 mg/dL (ref 0.44–1.00)
GFR calc Af Amer: >60 mL/min (ref >60)
GFR calc non Af Amer: >60 mL/min (ref >60)
Glucose, Bld: 126 mg/dL — ABNORMAL HIGH (ref 70–99)
Potassium: 4.6 mmol/L (ref 3.5–5.1)
Sodium: 141 mmol/L (ref 135–145)
Total Bilirubin: 1.2 mg/dL (ref 0.3–1.2)
Total Protein: 7.3 g/dL (ref 6.5–8.1)

## 2020-05-13 LAB — CBC WITH DIFFERENTIAL/PLATELET
Abs Immature Granulocytes: 0.26 10*3/uL — ABNORMAL HIGH (ref 0.00–0.07)
Basophils Absolute: 0 10*3/uL (ref 0.0–0.1)
Basophils Relative: 0 %
Eosinophils Absolute: 0 10*3/uL (ref 0.0–0.5)
Eosinophils Relative: 0 %
HCT: 39.2 % (ref 36.0–46.0)
Hemoglobin: 11.9 g/dL — ABNORMAL LOW (ref 12.0–15.0)
Immature Granulocytes: 1 %
Lymphocytes Relative: 13 %
Lymphs Abs: 2.5 10*3/uL (ref 0.7–4.0)
MCH: 26.5 pg (ref 26.0–34.0)
MCHC: 30.4 g/dL (ref 30.0–36.0)
MCV: 87.3 fL (ref 80.0–100.0)
Monocytes Absolute: 0.4 10*3/uL (ref 0.1–1.0)
Monocytes Relative: 2 %
Neutro Abs: 15.7 10*3/uL — ABNORMAL HIGH (ref 1.7–7.7)
Neutrophils Relative %: 84 %
Platelets: 448 10*3/uL — ABNORMAL HIGH (ref 150–400)
RBC: 4.49 MIL/uL (ref 3.87–5.11)
RDW: 12.6 % (ref 11.5–15.5)
WBC: 18.9 10*3/uL — ABNORMAL HIGH (ref 4.0–10.5)
nRBC: 0 % (ref 0.0–0.2)

## 2020-05-13 LAB — C-REACTIVE PROTEIN: CRP: 1.2 mg/dL — ABNORMAL HIGH (ref <1.0)

## 2020-05-13 LAB — HEPATITIS B SURFACE ANTIGEN: Hepatitis B Surface Ag: NONREACTIVE

## 2020-05-13 LAB — BRAIN NATRIURETIC PEPTIDE: B Natriuretic Peptide: 59.8 pg/mL (ref 0.0–100.0)

## 2020-05-13 LAB — CBG MONITORING, ED
Glucose-Capillary: 135 mg/dL — ABNORMAL HIGH (ref 70–99)
Glucose-Capillary: 111 mg/dL — ABNORMAL HIGH (ref 70–99)

## 2020-05-13 LAB — MAGNESIUM: Magnesium: 2.2 mg/dL (ref 1.7–2.4)

## 2020-05-13 LAB — HEMOGLOBIN A1C
Hgb A1c MFr Bld: 6.3 % — ABNORMAL HIGH (ref 4.8–5.6)
Mean Plasma Glucose: 134.11 mg/dL

## 2020-05-13 LAB — HIV ANTIBODY (ROUTINE TESTING W REFLEX): HIV Screen 4th Generation wRfx: NONREACTIVE

## 2020-05-13 LAB — GLUCOSE, CAPILLARY
Glucose-Capillary: 124 mg/dL — ABNORMAL HIGH (ref 70–99)
Glucose-Capillary: 142 mg/dL — ABNORMAL HIGH (ref 70–99)

## 2020-05-13 LAB — D-DIMER, QUANTITATIVE: D-Dimer, Quant: 11.39 ug/mL-FEU — ABNORMAL HIGH (ref 0.00–0.50)

## 2020-05-13 MED ORDER — METOPROLOL TARTRATE 25 MG PO TABS
25.0000 mg | ORAL_TABLET | Freq: Two times a day (BID) | ORAL | Status: DC
  Administered 2020-05-13 – 2020-05-16 (×4): 25 mg via ORAL
  Filled 2020-05-13 (×6): qty 1

## 2020-05-13 MED ORDER — LORATADINE 10 MG PO TABS: 10.0000 mg | ORAL_TABLET | Freq: Every day | ORAL | Status: DC | PRN

## 2020-05-13 MED ORDER — INSULIN ASPART 100 UNIT/ML ~~LOC~~ SOLN
0.0000 [IU] | Freq: Three times a day (TID) | SUBCUTANEOUS | Status: DC
  Administered 2020-05-13 (×2): 1 [IU] via SUBCUTANEOUS

## 2020-05-13 MED ORDER — POLYVINYL ALCOHOL 1.4 % OP SOLN
1.0000 [drp] | OPHTHALMIC | Status: DC | PRN
  Filled 2020-05-13: qty 15

## 2020-05-13 MED ORDER — METHYLPREDNISOLONE SODIUM SUCC 125 MG IJ SOLR
60.0000 mg | Freq: Two times a day (BID) | INTRAMUSCULAR | Status: DC
  Administered 2020-05-13 – 2020-05-14 (×2): 60 mg via INTRAVENOUS
  Filled 2020-05-13 (×2): qty 2

## 2020-05-13 MED ORDER — ENOXAPARIN SODIUM 80 MG/0.8ML ~~LOC~~ SOLN
80.0000 mg | Freq: Two times a day (BID) | SUBCUTANEOUS | Status: DC
  Administered 2020-05-13 – 2020-05-16 (×5): 80 mg via SUBCUTANEOUS
  Filled 2020-05-13 (×7): qty 0.8

## 2020-05-13 MED ORDER — SODIUM CHLORIDE 0.9 % IV SOLN
100.0000 mg | Freq: Every day | INTRAVENOUS | Status: AC
  Administered 2020-05-13 – 2020-05-15 (×4): 100 mg via INTRAVENOUS
  Filled 2020-05-13 (×3): qty 20

## 2020-05-13 MED ORDER — INSULIN ASPART 100 UNIT/ML ~~LOC~~ SOLN: 0.0000 [IU] | Freq: Every day | SUBCUTANEOUS | Status: DC

## 2020-05-13 NOTE — ED Notes (Signed)
Pt. Refused blood draw .stated she has been a pin cushon no one gets labs .she wants Iv team to come no sticks

## 2020-05-13 NOTE — Progress Notes (Signed)
Bilateral lower extremity venous duplex completed. Refer to "CV Proc" under chart review to view preliminary results.  05/13/2020 9:50 AM Eula Fried., MHA, RVT, RDCS, RDMS

## 2020-05-13 NOTE — ED Notes (Signed)
bfast ordered 

## 2020-05-13 NOTE — Progress Notes (Signed)
ANTICOAGULATION CONSULT NOTE - Initial Consult  Pharmacy Consult for lovenox Indication: empiric for r/o VTE and risk of VTE  No Known Allergies  Patient Measurements: Height: 5\' 4"  (162.6 cm) Weight: 81.6 kg (180 lb) IBW/kg (Calculated) : 54.7  Vital Signs: Temp: 98.3 F (36.8 C) (09/30 2200) BP: 149/90 (10/01 0700) Pulse Rate: 99 (10/01 0700)  Labs: Recent Labs    05/11/20 1440 05/12/20 1230 05/12/20 1422 05/12/20 1601  HGB  --   --  11.4*  --   HCT  --   --  37.7  --   PLT  --   --  251  --   CREATININE  --  0.64  --   --   TROPONINIHS 33*  --   --  27*    Estimated Creatinine Clearance: 78.3 mL/min (by C-G formula based on SCr of 0.64 mg/dL).   Medical History: Past Medical History:  Diagnosis Date  . Prediabetes    Assessment: 16 yof presented to the ED with hypoxia from COVID. D-dimer is elevated at 14.68. CT chest is negative for PE and doppers are pending. To start empiric lovenox until d-dimer is <2. Hgb is slightly low and platelets are WNL. She is not on anticoagulation PTA.   Goal of Therapy:  Anti-Xa level 0.6-1 units/ml 4hrs after LMWH dose given Monitor platelets by anticoagulation protocol: Yes   Plan:  Lovenox 80mg  SQ Q12H - reduce to prophylactic dosing when d-dimer is <2 and VTE is ruled out F/u CBC, d-dimer F/u dopplers  Sabri Teal, 45 05/13/2020,7:29 AM

## 2020-05-13 NOTE — ED Notes (Signed)
Pt given supplies to wash up, stretcher exchanged for hospital bed, recliner in room

## 2020-05-13 NOTE — ED Notes (Signed)
Pt desat to 80% while ambulating on room air. Pt placed back on 2L [place], sats at 97%, oxygen titrated down to 1L at this time

## 2020-05-13 NOTE — ED Notes (Signed)
Pt sitting up in recliner, given incentive spirometer and instructed on use, pt verbalized understanding

## 2020-05-13 NOTE — Progress Notes (Signed)
PROGRESS NOTE                                                                                                                                                                                                             Patient Demographics:    Angela Duran, is a 59 y.o. female, DOB - Sep 16, 1960, ZOX:[id number]  Outpatient Primary MD for the patient is Patient, No Pcp Per    LOS - 1  Admit date - 05/11/2020    Chief Complaint  Patient presents with   Shortness of Breath       Brief Narrative - Angela GooCarolyn Zaugg is a 59 y.o. female with no significant past medical history presents to emergency department for evaluation of low oxygen saturation, she was diagnosed with COVID-19 pneumonia with acute hypoxic respiratory failure and admitted to the hospital.   Subjective:    Angela Gooarolyn Mangini today has, No headache, No chest pain, No abdominal pain - No Nausea, No new weakness tingling or numbness, mild SOB.     Assessment  & Plan :      1. Acute Hypoxic Resp. Failure due to Acute Covid 19 Viral Pneumonitis during the ongoing 2020 Covid 19 Pandemic - she fortunately not vaccinated and has incurred moderate parenchymal lung injury due to COVID-19 pneumonia, she has been started on IV steroids along with remdesivir and supplemental oxygen.  Continue to monitor closely.  Encouraged the patient to sit up in chair in the daytime use I-S and flutter valve for pulmonary toiletry and then prone in bed when at night.  Will advance activity and titrate down oxygen as possible.       SpO2: 100 % O2 Flow Rate (L/min): 1 L/min  Recent Labs  Lab 05/12/20 1115 05/12/20 1230 05/12/20 1422 05/13/20 0807 05/13/20 1000  WBC  --   --  8.7 18.9*  --   PLT  --   --  251 448*  --   CRP  --  2.4*  --  1.2*  --   BNP  --   --  36.5 59.8  --   DDIMER  --  14.68*  --   --  11.39*  PROCALCITON  --  0.14  --   --   --   AST  --  53*  --   53*  --  ALT  --  66*  --  67*  --   ALKPHOS  --  94  --  87  --   BILITOT  --  0.9  --  1.2  --   ALBUMIN  --  2.8*  --  2.8*  --   LATICACIDVEN 1.3  --   --   --   --     2.  Elevated D-dimer due to intense inflammation.  Negative CT angiogram and leg ultrasound, full dose Lovenox till D-dimer drops below 2.  3.  Hypertension.  Placed on low-dose beta-blocker will monitor.  4.  Mild asymptomatic transaminitis due to COVID-19 viral infection.  Trend.     Condition -   Guarded  Family Communication  : Dorthula Nettles [phone] on 05/13/20 - 11.26 am message left.  Code Status :  Full  Consults  :  None  Procedures  :    CTA - No PE  Leg ultrasound - no DVT.  PUD Prophylaxis :   Disposition Plan  :    Status is: Inpatient  Remains inpatient appropriate because:IV treatments appropriate due to intensity of illness or inability to take PO   Dispo: The patient is from: Home              Anticipated d/c is to: Home              Anticipated d/c date is: > 3 days              Patient currently is not medically stable to d/c.  DVT Prophylaxis  :  Lovenox   Lab Results  Component Value Date   PLT 448 (H) 05/13/2020    Diet :  Diet Order            Diet regular Room service appropriate? Yes; Fluid consistency: Thin  Diet effective now                  Inpatient Medications  Scheduled Meds:  vitamin C  500 mg Oral Daily   enoxaparin (LOVENOX) injection  80 mg Subcutaneous Q12H   insulin aspart  0-5 Units Subcutaneous QHS   insulin aspart  0-9 Units Subcutaneous TID WC   methylPREDNISolone (SOLU-MEDROL) injection  60 mg Intravenous Q12H   metoprolol tartrate  25 mg Oral BID   zinc sulfate  220 mg Oral Daily   Continuous Infusions:  remdesivir 100 mg in NS 100 mL 100 mg (05/13/20 1056)   PRN Meds:.acetaminophen, chlorpheniramine-HYDROcodone, guaiFENesin-dextromethorphan, [DISCONTINUED] ondansetron **OR** ondansetron (ZOFRAN) IV  Antibiotics  :     Anti-infectives (From admission, onward)   Start     Dose/Rate Route Frequency Ordered Stop   05/13/20 1000  remdesivir 100 mg in sodium chloride 0.9 % 100 mL IVPB  Status:  Discontinued       "Followed by" Linked Group Details   100 mg 200 mL/hr over 30 Minutes Intravenous Daily 05/12/20 1623 05/12/20 1632   05/13/20 1000  remdesivir 100 mg in sodium chloride 0.9 % 100 mL IVPB  Status:  Discontinued       "Followed by" Linked Group Details   100 mg 200 mL/hr over 30 Minutes Intravenous Daily 05/12/20 1632 05/13/20 0754   05/13/20 1000  remdesivir 100 mg in sodium chloride 0.9 % 100 mL IVPB        100 mg 200 mL/hr over 30 Minutes Intravenous Daily 05/13/20 0754 05/17/20 0959   05/12/20 1700  remdesivir 100 mg in sodium chloride  0.9 % 100 mL IVPB       "Followed by" Linked Group Details   100 mg 200 mL/hr over 30 Minutes Intravenous Every 30 min 05/12/20 1632 05/12/20 1759   05/12/20 1630  remdesivir 200 mg in sodium chloride 0.9% 250 mL IVPB  Status:  Discontinued       "Followed by" Linked Group Details   200 mg 580 mL/hr over 30 Minutes Intravenous Once 05/12/20 1623 05/12/20 1632       Time Spent in minutes  30   Susa Raring M.D on 05/13/2020 at 11:24 AM  To page go to [email or web address] - password [email or web address]  [facility] -  Office  [phone]     See all Orders from today for further details    Objective:   Vitals:   05/13/20 0700 05/13/20 0800 05/13/20 0914 05/13/20 1100  BP: (!) 149/90 (!) 144/69    Pulse: 99 (!) 108 (!) 106 (!) 107  Resp:  (!) 24 (!) 26 16  Temp:  97.8 F (36.6 C)    TempSrc:  Oral    SpO2: 98% 94% 96% 100%  Weight:      Height:        Wt Readings from Last 3 Encounters:  05/11/20 81.6 kg  04/30/20 81.6 kg  11/19/19 83.5 kg     Intake/Output Summary (Last 24 hours) at 05/13/2020 1124 Last data filed at 05/12/2020 1821 Gross per 24 hour  Intake 148.27 ml  Output --  Net 148.27 ml     Physical Exam  Awake Alert, No new F.N  deficits, Normal affect [place].AT,PERRAL Supple Neck,No JVD, No cervical lymphadenopathy appriciated.  Symmetrical Chest wall movement, Good air movement bilaterally, CTAB RRR,No Gallops,Rubs or new Murmurs, No Parasternal Heave +ve B.Sounds, Abd Soft, No tenderness, No organomegaly appriciated, No rebound - guarding or rigidity. No Cyanosis, Clubbing or edema, No new Rash or bruise     Data Review:    CBC Recent Labs  Lab 05/12/20 1422 05/13/20 0807  WBC 8.7 18.9*  HGB 11.4* 11.9*  HCT 37.7 39.2  PLT 251 448*  MCV 86.9 87.3  MCH 26.3 26.5  MCHC 30.2 30.4  RDW 12.3 12.6  LYMPHSABS  --  2.5  MONOABS  --  0.4  EOSABS  --  0.0  BASOSABS  --  0.0    Recent Labs  Lab 05/12/20 1115 05/12/20 1230 05/12/20 1422 05/13/20 0807 05/13/20 1000  NA  --  138  --  141  --   K  --  3.7  --  4.6  --   CL  --  102  --  108  --   CO2  --  25  --  21*  --   GLUCOSE  --  96  --  126*  --   BUN  --  9  --  17  --   CREATININE  --  0.64  --  0.75  --   CALCIUM  --  8.8*  --  8.9  --   AST  --  53*  --  53*  --   ALT  --  66*  --  67*  --   ALKPHOS  --  94  --  87  --   BILITOT  --  0.9  --  1.2  --   ALBUMIN  --  2.8*  --  2.8*  --   MG  --   --   --  2.2  --  CRP  --  2.4*  --  1.2*  --   DDIMER  --  14.68*  --   --  11.39*  PROCALCITON  --  0.14  --   --   --   LATICACIDVEN 1.3  --   --   --   --   HGBA1C  --   --   --  6.3*  --   BNP  --   --  36.5 59.8  --     ------------------------------------------------------------------------------------------------------------------ Recent Labs    05/12/20 1230  TRIG 262*    Lab Results  Component Value Date   HGBA1C 6.3 (H) 05/13/2020   ------------------------------------------------------------------------------------------------------------------ No results for input(s): TSH, T4TOTAL, T3FREE, THYROIDAB in the last 72 hours.  Invalid input(s): FREET3  Cardiac Enzymes No results for input(s): CKMB, TROPONINI, MYOGLOBIN  in the last 168 hours.  Invalid input(s): CK ------------------------------------------------------------------------------------------------------------------    Component Value Date/Time   BNP 59.8 05/13/2020 0807    Micro Results Recent Results (from the past 240 hour(s))  Blood Culture (routine x 2)     Status: None (Preliminary result)   Collection Time: 05/12/20 11:15 AM   Specimen: BLOOD  Result Value Ref Range Status   Specimen Description BLOOD SITE NOT SPECIFIED  Final   Special Requests   Final    BOTTLES DRAWN AEROBIC AND ANAEROBIC Blood Culture adequate volume   Culture   Final    NO GROWTH < 24 HOURS Performed at [facility], [address], [place], [place] [address]    Report Status PENDING  Incomplete  Blood Culture (routine x 2)     Status: None (Preliminary result)   Collection Time: 05/12/20  3:51 PM   Specimen: BLOOD LEFT HAND  Result Value Ref Range Status   Specimen Description BLOOD LEFT HAND  Final   Special Requests   Final    BOTTLES DRAWN AEROBIC AND ANAEROBIC Blood Culture results may not be optimal due to an inadequate volume of blood received in culture bottles   Culture   Final    NO GROWTH < 24 HOURS Performed at [facility], [address] [place], [place] [address]    Report Status PENDING  Incomplete    Radiology Reports DG Chest 1 View  Result Date: 05/11/2020 CLINICAL DATA:  Decreased oxygen saturation EXAM: CHEST  1 VIEW COMPARISON:  April 30, 2020 FINDINGS: Ill-defined airspace opacity is noted throughout the lungs bilaterally, increased from recent study. Heart size and pulmonary vascularity are normal. No adenopathy. No bone lesions. IMPRESSION: Multifocal airspace opacity, increased from most recent study. Suspect atypical organism pneumonia. Cardiac silhouette normal. No adenopathy. Electronically Signed   By: Bretta Bang III M.D.   On: 05/11/2020 16:09   CT Angio Chest PE W and/or Wo Contrast  Result  Date: 05/12/2020 CLINICAL DATA:  Recent COVID.  Hypoxia, high probability PE EXAM: CT ANGIOGRAPHY CHEST WITH CONTRAST TECHNIQUE: Multidetector CT imaging of the chest was performed using the standard protocol during bolus administration of intravenous contrast. Multiplanar CT image reconstructions and MIPs were obtained to evaluate the vascular anatomy. CONTRAST:  61mL OMNIPAQUE IOHEXOL 350 MG/ML SOLN COMPARISON:  CT 06/25/2017 FINDINGS: Cardiovascular: Heart size normal. No pericardial effusion. Satisfactory opacification of pulmonary arteries noted, and there is no evidence of pulmonary emboli. Adequate contrast opacification of the thoracic aorta with no evidence of dissection, aneurysm, or stenosis. There is classic 3-vessel brachiocephalic arch anatomy without proximal stenosis. Mediastinum/Nodes: No hilar or mediastinal adenopathy. Lungs/Pleura:  No pleural effusion. No pneumothorax. Coarse peripheral airspace opacities throughout both lungs with some associated interstitial thickening, new since previous. Upper Abdomen: No acute findings Musculoskeletal: No chest wall abnormality. No acute or significant osseous findings. Review of the MIP images confirms the above findings. IMPRESSION: 1. Negative for acute PE or thoracic aortic dissection. 2. Extensive pulmonary parenchymal changes consistent with organizing pneumonia, likely subacute COVID. Electronically Signed   By: Corlis Leak M.D.   On: 05/12/2020 15:58   DG Chest Portable 1 View  Result Date: 04/30/2020 CLINICAL DATA:  Cough. COVID positive. EXAM: PORTABLE CHEST 1 VIEW COMPARISON:  None. FINDINGS: Very mild, hazy atelectasis and/or early infiltrate is seen within the bilateral lung bases. There is no evidence of a pleural effusion or pneumothorax. The heart size and mediastinal contours are within normal limits. The visualized skeletal structures are unremarkable. IMPRESSION: Very mild, hazy bibasilar atelectasis and/or early infiltrate.  Electronically Signed   By: Aram Candela M.D.   On: 04/30/2020 22:37   VAS [place] LOWER EXTREMITY VENOUS (DVT)  Result Date: 05/13/2020  Lower Venous DVTStudy Indications: COVID-19 positive. D-dimer=14.68.  Comparison Study: No prior study Performing Technologist: Gertie Fey MHA, RDMS, RVT, RDCS  Examination Guidelines: A complete evaluation includes B-mode imaging, spectral Doppler, color Doppler, and power Doppler as needed of all accessible portions of each vessel. Bilateral testing is considered an integral part of a complete examination. Limited examinations for reoccurring indications may be performed as noted. The reflux portion of the exam is performed with the patient in reverse Trendelenburg.  +---------+---------------+---------+-----------+----------+--------------+  RIGHT     Compressibility Phasicity Spontaneity Properties Thrombus Aging  +---------+---------------+---------+-----------+----------+--------------+  CFV       Full            Yes       Yes                                    +---------+---------------+---------+-----------+----------+--------------+  SFJ       Full                                                             +---------+---------------+---------+-----------+----------+--------------+  FV Prox   Full                                                             +---------+---------------+---------+-----------+----------+--------------+  FV Mid    Full                                                             +---------+---------------+---------+-----------+----------+--------------+  FV Distal Full                                                             +---------+---------------+---------+-----------+----------+--------------+  PFV       Full                                                             +---------+---------------+---------+-----------+----------+--------------+  POP       Full            Yes       Yes                                     +---------+---------------+---------+-----------+----------+--------------+  PTV       Full                                                             +---------+---------------+---------+-----------+----------+--------------+  PERO      Full                                                             +---------+---------------+---------+-----------+----------+--------------+   +---------+---------------+---------+-----------+----------+--------------+  LEFT      Compressibility Phasicity Spontaneity Properties Thrombus Aging  +---------+---------------+---------+-----------+----------+--------------+  CFV       Full            Yes       Yes                                    +---------+---------------+---------+-----------+----------+--------------+  SFJ       Full                                                             +---------+---------------+---------+-----------+----------+--------------+  FV Prox   Full                                                             +---------+---------------+---------+-----------+----------+--------------+  FV Mid    Full                                                             +---------+---------------+---------+-----------+----------+--------------+  FV Distal Full                                                             +---------+---------------+---------+-----------+----------+--------------+  PFV       Full                                                             +---------+---------------+---------+-----------+----------+--------------+  POP       Full            Yes       Yes                                    +---------+---------------+---------+-----------+----------+--------------+  PTV       Full                                                             +---------+---------------+---------+-----------+----------+--------------+  PERO      Full                                                              +---------+---------------+---------+-----------+----------+--------------+     Summary: RIGHT: - There is no evidence of deep vein thrombosis in the lower extremity.  - No cystic structure found in the popliteal fossa.  LEFT: - There is no evidence of deep vein thrombosis in the lower extremity.  - No cystic structure found in the popliteal fossa.  *See table(s) above for measurements and observations.    Preliminary

## 2020-05-14 LAB — CBC WITH DIFFERENTIAL/PLATELET
Abs Immature Granulocytes: 0.2 10*3/uL — ABNORMAL HIGH (ref 0.00–0.07)
Basophils Absolute: 0.1 10*3/uL (ref 0.0–0.1)
Basophils Relative: 0 %
Eosinophils Absolute: 0 10*3/uL (ref 0.0–0.5)
Eosinophils Relative: 0 %
HCT: 42.5 % (ref 36.0–46.0)
Hemoglobin: 13.3 g/dL (ref 12.0–15.0)
Immature Granulocytes: 1 %
Lymphocytes Relative: 16 %
Lymphs Abs: 3.2 10*3/uL (ref 0.7–4.0)
MCH: 27.1 pg (ref 26.0–34.0)
MCHC: 31.3 g/dL (ref 30.0–36.0)
MCV: 86.6 fL (ref 80.0–100.0)
Monocytes Absolute: 1.1 10*3/uL — ABNORMAL HIGH (ref 0.1–1.0)
Monocytes Relative: 5 %
Neutro Abs: 16.2 10*3/uL — ABNORMAL HIGH (ref 1.7–7.7)
Neutrophils Relative %: 78 %
Platelets: 455 10*3/uL — ABNORMAL HIGH (ref 150–400)
RBC: 4.91 MIL/uL (ref 3.87–5.11)
RDW: 13 % (ref 11.5–15.5)
WBC: 20.8 10*3/uL — ABNORMAL HIGH (ref 4.0–10.5)
nRBC: 0 % (ref 0.0–0.2)

## 2020-05-14 LAB — COMPREHENSIVE METABOLIC PANEL
ALT: 57 U/L — ABNORMAL HIGH (ref 0–44)
AST: 27 U/L (ref 15–41)
Albumin: 3.2 g/dL — ABNORMAL LOW (ref 3.5–5.0)
Alkaline Phosphatase: 78 U/L (ref 38–126)
Anion gap: 10 (ref 5–15)
BUN: 18 mg/dL (ref 6–20)
CO2: 25 mmol/L (ref 22–32)
Calcium: 8.7 mg/dL — ABNORMAL LOW (ref 8.9–10.3)
Chloride: 105 mmol/L (ref 98–111)
Creatinine, Ser: 0.75 mg/dL (ref 0.44–1.00)
GFR calc Af Amer: >60 mL/min (ref >60)
GFR calc non Af Amer: >60 mL/min (ref >60)
Glucose, Bld: 106 mg/dL — ABNORMAL HIGH (ref 70–99)
Potassium: 4 mmol/L (ref 3.5–5.1)
Sodium: 140 mmol/L (ref 135–145)
Total Bilirubin: 0.6 mg/dL (ref 0.3–1.2)
Total Protein: 7.7 g/dL (ref 6.5–8.1)

## 2020-05-14 LAB — GLUCOSE, CAPILLARY
Glucose-Capillary: 98 mg/dL (ref 70–99)
Glucose-Capillary: 116 mg/dL — ABNORMAL HIGH (ref 70–99)
Glucose-Capillary: 113 mg/dL — ABNORMAL HIGH (ref 70–99)
Glucose-Capillary: 106 mg/dL — ABNORMAL HIGH (ref 70–99)

## 2020-05-14 LAB — D-DIMER, QUANTITATIVE: D-Dimer, Quant: 7.2 ug/mL-FEU — ABNORMAL HIGH (ref 0.00–0.50)

## 2020-05-14 LAB — C-REACTIVE PROTEIN: CRP: 0.9 mg/dL (ref <1.0)

## 2020-05-14 LAB — MAGNESIUM: Magnesium: 2.3 mg/dL (ref 1.7–2.4)

## 2020-05-14 LAB — BRAIN NATRIURETIC PEPTIDE: B Natriuretic Peptide: 110 pg/mL — ABNORMAL HIGH (ref 0.0–100.0)

## 2020-05-14 MED ORDER — METHYLPREDNISOLONE SODIUM SUCC 40 MG IJ SOLR
40.0000 mg | Freq: Every day | INTRAMUSCULAR | Status: DC
  Administered 2020-05-15 – 2020-05-16 (×2): 40 mg via INTRAVENOUS
  Filled 2020-05-14 (×2): qty 1

## 2020-05-14 NOTE — Progress Notes (Signed)
PROGRESS NOTE                                                                                                                                                                                                             Patient Demographics:    Angela Duran, is a 59 y.o. female, DOB - 07/14/1961, DEY:[id number]  Outpatient Primary MD for the patient is Patient, No Pcp Per    LOS - 2  Admit date - 05/11/2020    Chief Complaint  Patient presents with  . Shortness of Breath       Brief Narrative - Angela Duran is a 59 y.o. female with no significant past medical history presents to emergency department for evaluation of low oxygen saturation, she was diagnosed with COVID-19 pneumonia with acute hypoxic respiratory failure and admitted to the hospital.   Subjective:   Patient in bed, appears comfortable, denies any headache, no fever, no chest pain or pressure, improved shortness of breath , no abdominal pain. No focal weakness. .     Assessment  & Plan :      1. Acute Hypoxic Resp. Failure due to Acute Covid 19 Viral Pneumonitis during the ongoing 2020 Covid 19 Pandemic - she fortunately not vaccinated and has incurred moderate parenchymal lung injury due to COVID-19 pneumonia, she has been started on IV steroids along with remdesivir and supplemental oxygen.  Continue to monitor closely.  Encouraged the patient to sit up in chair in the daytime use I-S and flutter valve for pulmonary toiletry and then prone in bed when at night.  Will advance activity and titrate down oxygen as possible.       SpO2: 94 % O2 Flow Rate (L/min): 1 L/min  Recent Labs  Lab 05/12/20 1115 05/12/20 1230 05/12/20 1422 05/13/20 0807 05/13/20 1000  WBC  --   --  8.7 18.9*  --   PLT  --   --  251 448*  --   CRP  --  2.4*  --  1.2*  --   BNP  --   --  36.5 59.8  --   DDIMER  --  14.68*  --   --  11.39*  PROCALCITON  --  0.14  --    --   --   AST  --  53*  --  53*  --  ALT  --  66*  --  67*  --   ALKPHOS  --  94  --  87  --   BILITOT  --  0.9  --  1.2  --   ALBUMIN  --  2.8*  --  2.8*  --   LATICACIDVEN 1.3  --   --   --   --     2.  Elevated D-dimer due to intense inflammation.  Negative CT angiogram and leg ultrasound, full dose Lovenox till D-dimer drops below 2, 2 weeks Eliquis post DC.  3.  Hypertension.  Placed on low-dose beta-blocker will monitor.  4.  Mild asymptomatic transaminitis due to COVID-19 viral infection.  Trend.     Condition -   Guarded  Family Communication  : Dorthula Nettles [phone] on 05/13/20 - 11.26 am message left.  Code Status :  Full  Consults  :  None  Procedures  :    CTA - No PE  Leg ultrasound - no DVT.  PUD Prophylaxis :   Disposition Plan  :    Status is: Inpatient  Remains inpatient appropriate because:IV treatments appropriate due to intensity of illness or inability to take PO   Dispo: The patient is from: Home              Anticipated d/c is to: Home              Anticipated d/c date is: > 3 days              Patient currently is not medically stable to d/c.  DVT Prophylaxis  :  Lovenox   Lab Results  Component Value Date   PLT 448 (H) 05/13/2020    Diet :  Diet Order            Diet regular Room service appropriate? Yes; Fluid consistency: Thin  Diet effective now                  Inpatient Medications  Scheduled Meds: . vitamin C  500 mg Oral Daily  . enoxaparin (LOVENOX) injection  80 mg Subcutaneous Q12H  . insulin aspart  0-5 Units Subcutaneous QHS  . insulin aspart  0-9 Units Subcutaneous TID WC  . [START ON 05/15/2020] methylPREDNISolone (SOLU-MEDROL) injection  40 mg Intravenous Daily  . metoprolol tartrate  25 mg Oral BID  . zinc sulfate  220 mg Oral Daily   Continuous Infusions: . remdesivir 100 mg in NS 100 mL 100 mg (05/14/20 0948)   PRN Meds:.acetaminophen, chlorpheniramine-HYDROcodone, guaiFENesin-dextromethorphan,  loratadine, [DISCONTINUED] ondansetron **OR** ondansetron (ZOFRAN) IV, polyvinyl alcohol  Antibiotics  :    Anti-infectives (From admission, onward)   Start     Dose/Rate Route Frequency Ordered Stop   05/13/20 1000  remdesivir 100 mg in sodium chloride 0.9 % 100 mL IVPB  Status:  Discontinued       "Followed by" Linked Group Details   100 mg 200 mL/hr over 30 Minutes Intravenous Daily 05/12/20 1623 05/12/20 1632   05/13/20 1000  remdesivir 100 mg in sodium chloride 0.9 % 100 mL IVPB  Status:  Discontinued       "Followed by" Linked Group Details   100 mg 200 mL/hr over 30 Minutes Intravenous Daily 05/12/20 1632 05/13/20 0754   05/13/20 1000  remdesivir 100 mg in sodium chloride 0.9 % 100 mL IVPB        100 mg 200 mL/hr over 30 Minutes Intravenous Daily 05/13/20 0754 05/17/20 0959  05/12/20 1700  remdesivir 100 mg in sodium chloride 0.9 % 100 mL IVPB       "Followed by" Linked Group Details   100 mg 200 mL/hr over 30 Minutes Intravenous Every 30 min 05/12/20 1632 05/12/20 1759   05/12/20 1630  remdesivir 200 mg in sodium chloride 0.9% 250 mL IVPB  Status:  Discontinued       "Followed by" Linked Group Details   200 mg 580 mL/hr over 30 Minutes Intravenous Once 05/12/20 1623 05/12/20 1632       Time Spent in minutes  30   Susa Raring M.D on 05/14/2020 at 11:41 AM  To page go to [email or web address] - password [facility]  [facility] -  Office  [phone]     See all Orders from today for further details    Objective:   Vitals:   05/13/20 1600 05/13/20 1626 05/13/20 2122 05/14/20 0508  BP: (!) 169/93 (!) 150/99 (!) 156/86 139/79  Pulse: 94  85 79  Resp: 20 20 20 20   Temp: 97.8 F (36.6 C) 98 F (36.7 C) 98.1 F (36.7 C) 98.1 F (36.7 C)  TempSrc: Oral Oral Oral Oral  SpO2: 97% 98% 94% 94%  Weight:      Height:        Wt Readings from Last 3 Encounters:  05/11/20 81.6 kg  04/30/20 81.6 kg  11/19/19 83.5 kg     Intake/Output Summary (Last 24 hours) at  05/14/2020 1141 Last data filed at 05/14/2020 0916 Gross per 24 hour  Intake 240 ml  Output --  Net 240 ml     Physical Exam  Awake Alert, No new F.N deficits, Normal affect [place].AT,PERRAL Supple Neck,No JVD, No cervical lymphadenopathy appriciated.  Symmetrical Chest wall movement, Good air movement bilaterally, CTAB RRR,No Gallops, Rubs or new Murmurs, No Parasternal Heave +ve B.Sounds, Abd Soft, No tenderness, No organomegaly appriciated, No rebound - guarding or rigidity. No Cyanosis, Clubbing or edema, No new Rash or bruise    Data Review:    CBC Recent Labs  Lab 05/12/20 1422 05/13/20 0807  WBC 8.7 18.9*  HGB 11.4* 11.9*  HCT 37.7 39.2  PLT 251 448*  MCV 86.9 87.3  MCH 26.3 26.5  MCHC 30.2 30.4  RDW 12.3 12.6  LYMPHSABS  --  2.5  MONOABS  --  0.4  EOSABS  --  0.0  BASOSABS  --  0.0    Recent Labs  Lab 05/12/20 1115 05/12/20 1230 05/12/20 1422 05/13/20 0807 05/13/20 1000  NA  --  138  --  141  --   K  --  3.7  --  4.6  --   CL  --  102  --  108  --   CO2  --  25  --  21*  --   GLUCOSE  --  96  --  126*  --   BUN  --  9  --  17  --   CREATININE  --  0.64  --  0.75  --   CALCIUM  --  8.8*  --  8.9  --   AST  --  53*  --  53*  --   ALT  --  66*  --  67*  --   ALKPHOS  --  94  --  87  --   BILITOT  --  0.9  --  1.2  --   ALBUMIN  --  2.8*  --  2.8*  --  MG  --   --   --  2.2  --   CRP  --  2.4*  --  1.2*  --   DDIMER  --  14.68*  --   --  11.39*  PROCALCITON  --  0.14  --   --   --   LATICACIDVEN 1.3  --   --   --   --   HGBA1C  --   --   --  6.3*  --   BNP  --   --  36.5 59.8  --     ------------------------------------------------------------------------------------------------------------------ Recent Labs    05/12/20 1230  TRIG 262*    Lab Results  Component Value Date   HGBA1C 6.3 (H) 05/13/2020   ------------------------------------------------------------------------------------------------------------------ No results for  input(s): TSH, T4TOTAL, T3FREE, THYROIDAB in the last 72 hours.  Invalid input(s): FREET3  Cardiac Enzymes No results for input(s): CKMB, TROPONINI, MYOGLOBIN in the last 168 hours.  Invalid input(s): CK ------------------------------------------------------------------------------------------------------------------    Component Value Date/Time   BNP 59.8 05/13/2020 0807    Micro Results Recent Results (from the past 240 hour(s))  Blood Culture (routine x 2)     Status: None (Preliminary result)   Collection Time: 05/12/20 11:15 AM   Specimen: BLOOD  Result Value Ref Range Status   Specimen Description BLOOD SITE NOT SPECIFIED  Final   Special Requests   Final    BOTTLES DRAWN AEROBIC AND ANAEROBIC Blood Culture adequate volume   Culture   Final    NO GROWTH 2 DAYS Performed at [facility], [address], [place], [place] [address]    Report Status PENDING  Incomplete  Blood Culture (routine x 2)     Status: None (Preliminary result)   Collection Time: 05/12/20  3:51 PM   Specimen: BLOOD LEFT HAND  Result Value Ref Range Status   Specimen Description BLOOD LEFT HAND  Final   Special Requests   Final    BOTTLES DRAWN AEROBIC AND ANAEROBIC Blood Culture results may not be optimal due to an inadequate volume of blood received in culture bottles   Culture   Final    NO GROWTH 2 DAYS Performed at [facility], [address] [place], [place] [address]    Report Status PENDING  Incomplete    Radiology Reports DG Chest 1 View  Result Date: 05/11/2020 CLINICAL DATA:  Decreased oxygen saturation EXAM: CHEST  1 VIEW COMPARISON:  April 30, 2020 FINDINGS: Ill-defined airspace opacity is noted throughout the lungs bilaterally, increased from recent study. Heart size and pulmonary vascularity are normal. No adenopathy. No bone lesions. IMPRESSION: Multifocal airspace opacity, increased from most recent study. Suspect atypical organism pneumonia. Cardiac silhouette  normal. No adenopathy. Electronically Signed   By: Bretta Bang III M.D.   On: 05/11/2020 16:09   CT Angio Chest PE W and/or Wo Contrast  Result Date: 05/12/2020 CLINICAL DATA:  Recent COVID.  Hypoxia, high probability PE EXAM: CT ANGIOGRAPHY CHEST WITH CONTRAST TECHNIQUE: Multidetector CT imaging of the chest was performed using the standard protocol during bolus administration of intravenous contrast. Multiplanar CT image reconstructions and MIPs were obtained to evaluate the vascular anatomy. CONTRAST:  60mL OMNIPAQUE IOHEXOL 350 MG/ML SOLN COMPARISON:  CT 06/25/2017 FINDINGS: Cardiovascular: Heart size normal. No pericardial effusion. Satisfactory opacification of pulmonary arteries noted, and there is no evidence of pulmonary emboli. Adequate contrast opacification of the thoracic aorta with no evidence of dissection, aneurysm, or stenosis. There is classic 3-vessel  brachiocephalic arch anatomy without proximal stenosis. Mediastinum/Nodes: No hilar or mediastinal adenopathy. Lungs/Pleura: No pleural effusion. No pneumothorax. Coarse peripheral airspace opacities throughout both lungs with some associated interstitial thickening, new since previous. Upper Abdomen: No acute findings Musculoskeletal: No chest wall abnormality. No acute or significant osseous findings. Review of the MIP images confirms the above findings. IMPRESSION: 1. Negative for acute PE or thoracic aortic dissection. 2. Extensive pulmonary parenchymal changes consistent with organizing pneumonia, likely subacute COVID. Electronically Signed   By: Corlis Leak M.D.   On: 05/12/2020 15:58   DG Chest Portable 1 View  Result Date: 04/30/2020 CLINICAL DATA:  Cough. COVID positive. EXAM: PORTABLE CHEST 1 VIEW COMPARISON:  None. FINDINGS: Very mild, hazy atelectasis and/or early infiltrate is seen within the bilateral lung bases. There is no evidence of a pleural effusion or pneumothorax. The heart size and mediastinal contours are within  normal limits. The visualized skeletal structures are unremarkable. IMPRESSION: Very mild, hazy bibasilar atelectasis and/or early infiltrate. Electronically Signed   By: Aram Candela M.D.   On: 04/30/2020 22:37   VAS [place] LOWER EXTREMITY VENOUS (DVT)  Result Date: 05/13/2020  Lower Venous DVTStudy Indications: COVID-19 positive. D-dimer=14.68.  Comparison Study: No prior study Performing Technologist: Gertie Fey MHA, RDMS, RVT, RDCS  Examination Guidelines: A complete evaluation includes B-mode imaging, spectral Doppler, color Doppler, and power Doppler as needed of all accessible portions of each vessel. Bilateral testing is considered an integral part of a complete examination. Limited examinations for reoccurring indications may be performed as noted. The reflux portion of the exam is performed with the patient in reverse Trendelenburg.  +---------+---------------+---------+-----------+----------+--------------+ RIGHT    CompressibilityPhasicitySpontaneityPropertiesThrombus Aging +---------+---------------+---------+-----------+----------+--------------+ CFV      Full           Yes      Yes                                 +---------+---------------+---------+-----------+----------+--------------+ SFJ      Full                                                        +---------+---------------+---------+-----------+----------+--------------+ FV Prox  Full                                                        +---------+---------------+---------+-----------+----------+--------------+ FV Mid   Full                                                        +---------+---------------+---------+-----------+----------+--------------+ FV DistalFull                                                        +---------+---------------+---------+-----------+----------+--------------+ PFV      Full                                                         +---------+---------------+---------+-----------+----------+--------------+  POP      Full           Yes      Yes                                 +---------+---------------+---------+-----------+----------+--------------+ PTV      Full                                                        +---------+---------------+---------+-----------+----------+--------------+ PERO     Full                                                        +---------+---------------+---------+-----------+----------+--------------+   +---------+---------------+---------+-----------+----------+--------------+ LEFT     CompressibilityPhasicitySpontaneityPropertiesThrombus Aging +---------+---------------+---------+-----------+----------+--------------+ CFV      Full           Yes      Yes                                 +---------+---------------+---------+-----------+----------+--------------+ SFJ      Full                                                        +---------+---------------+---------+-----------+----------+--------------+ FV Prox  Full                                                        +---------+---------------+---------+-----------+----------+--------------+ FV Mid   Full                                                        +---------+---------------+---------+-----------+----------+--------------+ FV DistalFull                                                        +---------+---------------+---------+-----------+----------+--------------+ PFV      Full                                                        +---------+---------------+---------+-----------+----------+--------------+ POP      Full           Yes      Yes                                 +---------+---------------+---------+-----------+----------+--------------+  PTV      Full                                                         +---------+---------------+---------+-----------+----------+--------------+ PERO     Full                                                        +---------+---------------+---------+-----------+----------+--------------+     Summary: RIGHT: - There is no evidence of deep vein thrombosis in the lower extremity.  - No cystic structure found in the popliteal fossa.  LEFT: - There is no evidence of deep vein thrombosis in the lower extremity.  - No cystic structure found in the popliteal fossa.  *See table(s) above for measurements and observations.    Preliminary

## 2020-05-14 NOTE — TOC Initial Note (Signed)
Transition of Care [facility]) - Initial/Assessment Note    Patient Details  Name: Angela Duran MRN: [id number] Date of Birth: 1961-03-12  Transition [facility]) CM/SW Contact:    Lockie Pares, RN Phone Number: 05/14/2020, 4:55 PM  Clinical Narrative:                 Admitted day 2 COVID pneumonia respiratory failure. Will be on eliquis for 2 weeks post discharge due to high D dimer.Increased risk of thrombus. eliquis card sent to the floor for patient.  Oxygen on 1L currently, may need oxygen post hospitalization, will continue to follow for needs.  Expected Discharge Plan: Home/Self Care Barriers to Discharge: Continued Medical Work up   Patient Goals and CMS Choice        Expected Discharge Plan and Services Expected Discharge Plan: Home/Self Care   Discharge Planning Services: CM Consult, Other - See comment (eliquis card given)   Living arrangements for the past 2 months: Single Family Home                                      Prior Living Arrangements/Services Living arrangements for the past 2 months: Single Family Home   Patient language and need for interpreter reviewed:: Yes        Need for Family Participation in Patient Care: Yes (Comment)     Criminal Activity/Legal Involvement Pertinent to Current Situation/Hospitalization: No - Comment as needed  Activities of Daily Living      Permission Sought/Granted                  Emotional Assessment       Orientation: : Oriented to Self, Oriented to Place, Oriented to  Time, Oriented to Situation Alcohol / Substance Use: Not Applicable Psych Involvement: No (comment)  Admission diagnosis:  Acute respiratory failure with hypoxia (HCC) [J96.01] Pneumonia due to COVID-19 virus [U07.1, J12.82] Patient Active Problem List   Diagnosis Date Noted  . Pneumonia due to COVID-19 virus 05/12/2020  . Elevated LFTs 05/12/2020  . Elevated d-dimer 05/12/2020   PCP:  Patient, No Pcp  Per Pharmacy:   [id number] [place], [place] - [address] AT [facility] [address] & [facility] [address] [place] [address] Phone: [phone] Fax: [phone]  [facility] [place], [place] - [address] [place] [address] Phone: [phone] Fax: [phone]  [facility] [address] [place]), [place] - [address] [place] [address] Phone: [phone] Fax: [phone]     Social Determinants of Health (SDOH) Interventions    Readmission Risk Interventions No flowsheet data found.

## 2020-05-14 NOTE — Evaluation (Signed)
Physical Therapy Evaluation Patient Details Name: Angela Duran MRN: [id number] DOB: 1961-01-22 Today's Date: 05/14/2020   History of Present Illness  59 y.o. female with no significant past medical history presents to emergency department for evaluation of low oxygen saturation after diagnosed with COVID 19 on 04/30/20. CT angio negative for PE.   Clinical Impression   Pt admitted with above diagnosis. 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 She is normally independent. She showed slight imbalance with ambulation in the hallway (likely due to walking much slower than she is accustomed to in order to keep her oxygen sats up). She required 2L with sats at lowest 90%. (Nursing attempted ambulation on room air earlier today and documented patient dropped into the 80s).  Pt currently with functional limitations due to the deficits listed below (see PT Problem List). Pt will benefit from skilled PT to increase their independence and safety with mobility to allow discharge to the venue listed below.       Follow Up Recommendations Outpatient PT (vs no follow-up therapy)    Equipment Recommendations  None recommended by PT    Recommendations for Other Services OT consult     Precautions / Restrictions Precautions Precautions: Other (comment) Precaution Comments: monitor O2 sats      Mobility  Bed Mobility                  Transfers Overall transfer level: Independent Equipment used: None             General transfer comment: from chair; reports walking to bathroom independently and RN aware  Ambulation/Gait Ambulation/Gait assistance: Supervision Gait Distance (Feet): 250 Feet Assistive device: None Gait Pattern/deviations: Step-through pattern;Staggering left   Gait velocity interpretation: 1.31 - 2.62 ft/sec, indicative of limited community ambulator General Gait Details: stagger to her left x 1 (caught herself); vc for wider steps as she is used to  walking very fast and difficult to make herself slow down  Stairs            Wheelchair Mobility    Modified Rankin (Stroke Patients Only)       Balance Overall balance assessment: Mild deficits observed, not formally tested                                           Pertinent Vitals/Pain Pain Assessment: No/denies pain    Home Living Family/patient expects to be discharged to:: Private residence Living Arrangements: Alone Available Help at Discharge: Family;Friend(s);Available PRN/intermittently Type of Home: House Home Access: Stairs to enter Entrance Stairs-Rails: Right;Left;Can reach both Entrance Stairs-Number of Steps: 5 Home Layout: One level Home Equipment: None      Prior Function Level of Independence: Independent         Comments: works in an Teacher, adult education        Extremity/Trunk Assessment   Upper Extremity Assessment Upper Extremity Assessment: Overall WFL for tasks assessed    Lower Extremity Assessment Lower Extremity Assessment: Overall WFL for tasks assessed    Cervical / Trunk Assessment Cervical / Trunk Assessment: Normal  Communication   Communication: No difficulties  Cognition Arousal/Alertness: Awake/alert Behavior During Therapy: WFL for tasks assessed/performed Overall Cognitive Status: Within Functional Limits for tasks assessed  General Comments General comments (skin integrity, edema, etc.): Educated on proper positioning for lung recovery, use and frequency of IS, pursed lip breathing for recovery    Exercises     Assessment/Plan    PT Assessment Patient needs continued PT services  PT Problem List Decreased activity tolerance;Decreased balance;Decreased mobility;Decreased knowledge of use of DME;Cardiopulmonary status limiting activity       PT Treatment Interventions Gait training;Functional mobility training;Therapeutic  activities;Therapeutic exercise;Balance training;Patient/family education    PT Goals (Current goals can be found in the Care Plan section)  Acute Rehab PT Goals Patient Stated Goal: regain her independence and health PT Goal Formulation: With patient Time For Goal Achievement: 05/28/20 Potential to Achieve Goals: Good    Frequency Min 3X/week   Barriers to discharge Decreased caregiver support reports she has friends and family can assist her    Co-evaluation               AM-PAC PT "6 Clicks" Mobility  Outcome Measure Help needed turning from your back to your side while in a flat bed without using bedrails?: None Help needed moving from lying on your back to sitting on the side of a flat bed without using bedrails?: None Help needed moving to and from a bed to a chair (including a wheelchair)?: None Help needed standing up from a chair using your arms (e.g., wheelchair or bedside chair)?: None Help needed to walk in hospital room?: A Little Help needed climbing 3-5 steps with a railing? : A Little 6 Click Score: 22    End of Session Equipment Utilized During Treatment: Oxygen Activity Tolerance: Patient tolerated treatment well Patient left: in chair;with call bell/phone within reach   PT Visit Diagnosis: Difficulty in walking, not elsewhere classified (R26.2)    Time: 8841-6606 PT Time Calculation (min) (ACUTE ONLY): 31 min   Charges:   PT Evaluation $PT Eval Low Complexity: 1 Low PT Treatments $Self Care/Home Management: 8-22         Jerolyn Center, PT Pager [phone]   Zena Amos 05/14/2020, 5:29 PM

## 2020-05-15 LAB — CBC WITH DIFFERENTIAL/PLATELET
Abs Immature Granulocytes: 0.08 10*3/uL — ABNORMAL HIGH (ref 0.00–0.07)
Basophils Absolute: 0 10*3/uL (ref 0.0–0.1)
Basophils Relative: 0 %
Eosinophils Absolute: 0.1 10*3/uL (ref 0.0–0.5)
Eosinophils Relative: 0 %
HCT: 37.5 % (ref 36.0–46.0)
Hemoglobin: 11.9 g/dL — ABNORMAL LOW (ref 12.0–15.0)
Immature Granulocytes: 1 %
Lymphocytes Relative: 24 %
Lymphs Abs: 3.2 10*3/uL (ref 0.7–4.0)
MCH: 27.4 pg (ref 26.0–34.0)
MCHC: 31.7 g/dL (ref 30.0–36.0)
MCV: 86.4 fL (ref 80.0–100.0)
Monocytes Absolute: 1 10*3/uL (ref 0.1–1.0)
Monocytes Relative: 7 %
Neutro Abs: 9.1 10*3/uL — ABNORMAL HIGH (ref 1.7–7.7)
Neutrophils Relative %: 68 %
Platelets: 461 10*3/uL — ABNORMAL HIGH (ref 150–400)
RBC: 4.34 MIL/uL (ref 3.87–5.11)
RDW: 13 % (ref 11.5–15.5)
WBC: 13.4 10*3/uL — ABNORMAL HIGH (ref 4.0–10.5)
nRBC: 0 % (ref 0.0–0.2)

## 2020-05-15 LAB — COMPREHENSIVE METABOLIC PANEL
ALT: 51 U/L — ABNORMAL HIGH (ref 0–44)
AST: 35 U/L (ref 15–41)
Albumin: 2.5 g/dL — ABNORMAL LOW (ref 3.5–5.0)
Alkaline Phosphatase: 70 U/L (ref 38–126)
Anion gap: 10 (ref 5–15)
BUN: 16 mg/dL (ref 6–20)
CO2: 25 mmol/L (ref 22–32)
Calcium: 8.3 mg/dL — ABNORMAL LOW (ref 8.9–10.3)
Chloride: 104 mmol/L (ref 98–111)
Creatinine, Ser: 0.71 mg/dL (ref 0.44–1.00)
GFR calc Af Amer: >60 mL/min (ref >60)
GFR calc non Af Amer: >60 mL/min (ref >60)
Glucose, Bld: 95 mg/dL (ref 70–99)
Potassium: 3.7 mmol/L (ref 3.5–5.1)
Sodium: 139 mmol/L (ref 135–145)
Total Bilirubin: 0.7 mg/dL (ref 0.3–1.2)
Total Protein: 6.2 g/dL — ABNORMAL LOW (ref 6.5–8.1)

## 2020-05-15 LAB — GLUCOSE, CAPILLARY
Glucose-Capillary: 87 mg/dL (ref 70–99)
Glucose-Capillary: 121 mg/dL — ABNORMAL HIGH (ref 70–99)
Glucose-Capillary: 122 mg/dL — ABNORMAL HIGH (ref 70–99)
Glucose-Capillary: 104 mg/dL — ABNORMAL HIGH (ref 70–99)

## 2020-05-15 LAB — C-REACTIVE PROTEIN: CRP: 1.2 mg/dL — ABNORMAL HIGH (ref <1.0)

## 2020-05-15 LAB — MAGNESIUM: Magnesium: 2.3 mg/dL (ref 1.7–2.4)

## 2020-05-15 LAB — D-DIMER, QUANTITATIVE: D-Dimer, Quant: 6.85 ug/mL-FEU — ABNORMAL HIGH (ref 0.00–0.50)

## 2020-05-15 LAB — BRAIN NATRIURETIC PEPTIDE: B Natriuretic Peptide: 62.8 pg/mL (ref 0.0–100.0)

## 2020-05-15 NOTE — Evaluation (Signed)
Occupational Therapy Evaluation Patient Details Name: Angela Duran MRN: [id number] DOB: 07-Jan-1961 Today's Date: 05/15/2020    History of Present Illness 59 y.o. female with no significant past medical history presents to emergency department for evaluation of low oxygen saturation after diagnosed with COVID 19 on 04/30/20. CT angio negative for PE.    Clinical Impression   Patient evaluated by Occupational Therapy with no further acute OT needs identified. All education has been completed and the patient has no further questions. Pt is able to perform ADLs and functional mobility mod I.  She does fatigue and demonstrates DOE 3/4, but 02 sats remained mid 90s on RA.  Reviewed energy conservation strategies for home.  Recommend she sit to shower initially.  See below for any follow-up Occupational Therapy or equipment needs. OT is signing off. Thank you for this referral.      Follow Up Recommendations  No OT follow up;Supervision - Intermittent    Equipment Recommendations  None recommended by OT    Recommendations for Other Services       Precautions / Restrictions Precautions Precautions: Other (comment) Precaution Comments: monitor O2 sats      Mobility Bed Mobility                  Transfers Overall transfer level: Independent Equipment used: None                  Balance Overall balance assessment: Mild deficits observed, not formally tested                                         ADL either performed or assessed with clinical judgement   ADL Overall ADL's : Modified independent                                       General ADL Comments: Pt reports she performed morning ADLs mod I with a couple of seated rest breaks      Vision Baseline Vision/History: Wears glasses Wears Glasses: At all times Patient Visual Report: No change from baseline       Perception     Praxis      Pertinent Vitals/Pain Pain  Assessment: No/denies pain     Hand Dominance Right   Extremity/Trunk Assessment Upper Extremity Assessment Upper Extremity Assessment: Overall WFL for tasks assessed   Lower Extremity Assessment Lower Extremity Assessment: Overall WFL for tasks assessed   Cervical / Trunk Assessment Cervical / Trunk Assessment: Normal   Communication Communication Communication: No difficulties   Cognition Arousal/Alertness: Awake/alert Behavior During Therapy: WFL for tasks assessed/performed Overall Cognitive Status: Within Functional Limits for tasks assessed                                     General Comments  Pt ambulated >250' with 02 sats 96% on RA and HR 98.  DOE 3/4.   Pt instructed in energy conservation techniques and recommendation that she sit to shower initially and to pace self.  She will likely require assist for grocery shopping.  She verbally agreed     Exercises     Shoulder Instructions      Home Living Family/patient expects to be discharged to:: Private residence  Living Arrangements: Alone Available Help at Discharge: Family;Friend(s);Available PRN/intermittently Type of Home: House Home Access: Stairs to enter [employer] of Steps: 5 Entrance Stairs-Rails: Right;Left;Can reach both Home Layout: One level     Bathroom Shower/Tub: IT trainer: Standard Bathroom Accessibility: Yes   Home Equipment: Shower seat          Prior Functioning/Environment Level of Independence: Independent        Comments: works in an office        OT Problem List: Cardiopulmonary status limiting activity;Decreased activity tolerance      OT Treatment/Interventions:      OT Goals(Current goals can be found in the care plan section) Acute Rehab OT Goals Patient Stated Goal: regain her independence and health OT Goal Formulation: All assessment and education complete, DC therapy  OT Frequency:     Barriers to  D/C:            Co-evaluation              AM-PAC OT "6 Clicks" Daily Activity     Outcome Measure Help from another person eating meals?: None Help from another person taking care of personal grooming?: None Help from another person toileting, which includes using toliet, bedpan, or urinal?: None Help from another person bathing (including washing, rinsing, drying)?: None Help from another person to put on and taking off regular upper body clothing?: None Help from another person to put on and taking off regular lower body clothing?: None 6 Click Score: 24   End of Session Nurse Communication: Mobility status  Activity Tolerance: Patient tolerated treatment well Patient left: in chair;with call bell/phone within reach  OT Visit Diagnosis: Unsteadiness on feet (R26.81)                Time: 7062-3762 OT Time Calculation (min): 18 min Charges:  OT General Charges $OT Visit: 1 Visit OT Evaluation $OT Eval Low Complexity: 1 Low  Eber Jones., OTR/L Acute Rehabilitation Services Pager [phone] Office [phone]   Jeani Hawking M 05/15/2020, 5:26 PM

## 2020-05-15 NOTE — Progress Notes (Signed)
PROGRESS NOTE                                                                                                                                                                                                             Patient Demographics:    Angela Duran, is a 59 y.o. female, DOB - May 16, 1961, ZOX:[id number]  Outpatient Primary MD for the patient is Patient, No Pcp Per    LOS - 3  Admit date - 05/11/2020    Chief Complaint  Patient presents with  . Shortness of Breath       Brief Narrative - Angela Duran is a 59 y.o. female with no significant past medical history presents to emergency department for evaluation of low oxygen saturation, she was diagnosed with COVID-19 pneumonia with acute hypoxic respiratory failure and admitted to the hospital.   Subjective:   Patient in bed, appears comfortable, denies any headache, no fever, no chest pain or pressure, no shortness of breath , no abdominal pain. No focal weakness.     Assessment  & Plan :      1. Acute Hypoxic Resp. Failure due to Acute Covid 19 Viral Pneumonitis during the ongoing 2020 Covid 19 Pandemic - she fortunately not vaccinated and has incurred moderate parenchymal lung injury due to COVID-19 pneumonia, she has been started on IV steroids along with remdesivir and supplemental oxygen.  Continue to monitor closely.  Encouraged the patient to sit up in chair in the daytime use I-S and flutter valve for pulmonary toiletry and then prone in bed when at night.  Will advance activity and titrate down oxygen as possible.    SpO2: 91 % O2 Flow Rate (L/min): 1 L/min  Recent Labs  Lab 05/12/20 1115 05/12/20 1230 05/12/20 1422 05/13/20 0807 05/13/20 1000 05/14/20 1837 05/15/20 0442  WBC  --   --  8.7 18.9*  --  20.8* 13.4*  PLT  --   --  251 448*  --  455* 461*  CRP  --  2.4*  --  1.2*  --  0.9 1.2*  BNP  --   --  36.5 59.8  --  110.0* 62.8   DDIMER  --  14.68*  --   --  11.39* 7.20* 6.85*  PROCALCITON  --  0.14  --   --   --   --   --  AST  --  53*  --  53*  --  27 35  ALT  --  66*  --  67*  --  57* 51*  ALKPHOS  --  94  --  87  --  78 70  BILITOT  --  0.9  --  1.2  --  0.6 0.7  ALBUMIN  --  2.8*  --  2.8*  --  3.2* 2.5*  LATICACIDVEN 1.3  --   --   --   --   --   --     2.  Elevated D-dimer due to intense inflammation.  Negative CT angiogram and leg ultrasound, full dose Lovenox till D-dimer drops below 2, 2 weeks Eliquis post DC.  She had refused 2 doses of Lovenox while in the hospital, counseled and she is agreeable to take prescribed dose now.  3.  Hypertension.  Placed on low-dose beta-blocker will monitor.  4.  Mild asymptomatic transaminitis due to COVID-19 viral infection.  Trend.     Condition -   Guarded  Family Communication  : Dorthula Nettles [phone] on 05/13/20 - 11.26 am message left.  Code Status :  Full  Consults  :  None  Procedures  :    CTA - No PE  Leg ultrasound - no DVT.  PUD Prophylaxis :   Disposition Plan  :    Status is: Inpatient  Remains inpatient appropriate because:IV treatments appropriate due to intensity of illness or inability to take PO   Dispo: The patient is from: Home              Anticipated d/c is to: Home              Anticipated d/c date is: > 3 days              Patient currently is not medically stable to d/c.  DVT Prophylaxis  :  Lovenox   Lab Results  Component Value Date   PLT 461 (H) 05/15/2020    Diet :  Diet Order            Diet regular Room service appropriate? Yes; Fluid consistency: Thin  Diet effective now                  Inpatient Medications  Scheduled Meds: . vitamin C  500 mg Oral Daily  . enoxaparin (LOVENOX) injection  80 mg Subcutaneous Q12H  . insulin aspart  0-5 Units Subcutaneous QHS  . insulin aspart  0-9 Units Subcutaneous TID WC  . methylPREDNISolone (SOLU-MEDROL) injection  40 mg Intravenous Daily  . metoprolol  tartrate  25 mg Oral BID  . zinc sulfate  220 mg Oral Daily   Continuous Infusions: . remdesivir 100 mg in NS 100 mL 100 mg (05/15/20 0933)   PRN Meds:.acetaminophen, chlorpheniramine-HYDROcodone, guaiFENesin-dextromethorphan, loratadine, [DISCONTINUED] ondansetron **OR** ondansetron (ZOFRAN) IV, polyvinyl alcohol  Antibiotics  :    Anti-infectives (From admission, onward)   Start     Dose/Rate Route Frequency Ordered Stop   05/13/20 1000  remdesivir 100 mg in sodium chloride 0.9 % 100 mL IVPB  Status:  Discontinued       "Followed by" Linked Group Details   100 mg 200 mL/hr over 30 Minutes Intravenous Daily 05/12/20 1623 05/12/20 1632   05/13/20 1000  remdesivir 100 mg in sodium chloride 0.9 % 100 mL IVPB  Status:  Discontinued       "Followed by" Linked Group Details   100  mg 200 mL/hr over 30 Minutes Intravenous Daily 05/12/20 1632 05/13/20 0754   05/13/20 1000  remdesivir 100 mg in sodium chloride 0.9 % 100 mL IVPB        100 mg 200 mL/hr over 30 Minutes Intravenous Daily 05/13/20 0754 05/17/20 0959   05/12/20 1700  remdesivir 100 mg in sodium chloride 0.9 % 100 mL IVPB       "Followed by" Linked Group Details   100 mg 200 mL/hr over 30 Minutes Intravenous Every 30 min 05/12/20 1632 05/12/20 1759   05/12/20 1630  remdesivir 200 mg in sodium chloride 0.9% 250 mL IVPB  Status:  Discontinued       "Followed by" Linked Group Details   200 mg 580 mL/hr over 30 Minutes Intravenous Once 05/12/20 1623 05/12/20 1632       Time Spent in minutes  30   Susa Raring M.D on 05/15/2020 at 9:45 AM  To page go to [email or web address] - password [facility]  [facility] -  Office  [phone]   See all Orders from today for further details    Objective:   Vitals:   05/14/20 0508 05/14/20 1429 05/14/20 2041 05/15/20 0415  BP: 139/79 (!) 141/72 134/68 112/85  Pulse: 79 99 78 88  Resp: 20 20 20 18   Temp: 98.1 F (36.7 C) 97.8 F (36.6 C) 98.8 F (37.1 C) 98.3 F (36.8 C)   TempSrc: Oral Oral Oral Oral  SpO2: 94% 91% 98% 91%  Weight:      Height:        Wt Readings from Last 3 Encounters:  05/11/20 81.6 kg  04/30/20 81.6 kg  11/19/19 83.5 kg     Intake/Output Summary (Last 24 hours) at 05/15/2020 0945 Last data filed at 05/14/2020 1826 Gross per 24 hour  Intake 360 ml  Output --  Net 360 ml     Physical Exam  Awake Alert, No new F.N deficits, Normal affect [place].AT,PERRAL Supple Neck,No JVD, No cervical lymphadenopathy appriciated.  Symmetrical Chest wall movement, Good air movement bilaterally, CTAB RRR,No Gallops, Rubs or new Murmurs, No Parasternal Heave +ve B.Sounds, Abd Soft, No tenderness, No organomegaly appriciated, No rebound - guarding or rigidity. No Cyanosis, Clubbing or edema, No new Rash or bruise    Data Review:    CBC Recent Labs  Lab 05/12/20 1422 05/13/20 0807 05/14/20 1837 05/15/20 0442  WBC 8.7 18.9* 20.8* 13.4*  HGB 11.4* 11.9* 13.3 11.9*  HCT 37.7 39.2 42.5 37.5  PLT 251 448* 455* 461*  MCV 86.9 87.3 86.6 86.4  MCH 26.3 26.5 27.1 27.4  MCHC 30.2 30.4 31.3 31.7  RDW 12.3 12.6 13.0 13.0  LYMPHSABS  --  2.5 3.2 3.2  MONOABS  --  0.4 1.1* 1.0  EOSABS  --  0.0 0.0 0.1  BASOSABS  --  0.0 0.1 0.0    Recent Labs  Lab 05/12/20 1115 05/12/20 1230 05/12/20 1422 05/13/20 0807 05/13/20 1000 05/14/20 1837 05/15/20 0442  NA  --  138  --  141  --  140 139  K  --  3.7  --  4.6  --  4.0 3.7  CL  --  102  --  108  --  105 104  CO2  --  25  --  21*  --  25 25  GLUCOSE  --  96  --  126*  --  106* 95  BUN  --  9  --  17  --  18 16  CREATININE  --  0.64  --  0.75  --  0.75 0.71  CALCIUM  --  8.8*  --  8.9  --  8.7* 8.3*  AST  --  53*  --  53*  --  27 35  ALT  --  66*  --  67*  --  57* 51*  ALKPHOS  --  94  --  87  --  78 70  BILITOT  --  0.9  --  1.2  --  0.6 0.7  ALBUMIN  --  2.8*  --  2.8*  --  3.2* 2.5*  MG  --   --   --  2.2  --  2.3 2.3  CRP  --  2.4*  --  1.2*  --  0.9 1.2*  DDIMER  --  14.68*  --   --   11.39* 7.20* 6.85*  PROCALCITON  --  0.14  --   --   --   --   --   LATICACIDVEN 1.3  --   --   --   --   --   --   HGBA1C  --   --   --  6.3*  --   --   --   BNP  --   --  36.5 59.8  --  110.0* 62.8   ------------------------------------------------------------------------------------------------------------------ Recent Labs    05/12/20 1230  TRIG 262*    Lab Results  Component Value Date   HGBA1C 6.3 (H) 05/13/2020   ------------------------------------------------------------------------------------------------------------------ No results for input(s): TSH, T4TOTAL, T3FREE, THYROIDAB in the last 72 hours.  Invalid input(s): FREET3  Cardiac Enzymes No results for input(s): CKMB, TROPONINI, MYOGLOBIN in the last 168 hours.  Invalid input(s): CK ------------------------------------------------------------------------------------------------------------------    Component Value Date/Time   BNP 62.8 05/15/2020 0442    Micro Results Recent Results (from the past 240 hour(s))  Blood Culture (routine x 2)     Status: None (Preliminary result)   Collection Time: 05/12/20 11:15 AM   Specimen: BLOOD  Result Value Ref Range Status   Specimen Description BLOOD SITE NOT SPECIFIED  Final   Special Requests   Final    BOTTLES DRAWN AEROBIC AND ANAEROBIC Blood Culture adequate volume   Culture   Final    NO GROWTH 2 DAYS Performed at [facility], [address], [place], [place] [address]    Report Status PENDING  Incomplete  Blood Culture (routine x 2)     Status: None (Preliminary result)   Collection Time: 05/12/20  3:51 PM   Specimen: BLOOD LEFT HAND  Result Value Ref Range Status   Specimen Description BLOOD LEFT HAND  Final   Special Requests   Final    BOTTLES DRAWN AEROBIC AND ANAEROBIC Blood Culture results may not be optimal due to an inadequate volume of blood received in culture bottles   Culture   Final    NO GROWTH 2 DAYS Performed at [facility], [address], [place], [place] [address]    Report Status PENDING  Incomplete    Radiology Reports DG Chest 1 View  Result Date: 05/11/2020 CLINICAL DATA:  Decreased oxygen saturation EXAM: CHEST  1 VIEW COMPARISON:  April 30, 2020 FINDINGS: Ill-defined airspace opacity is noted throughout the lungs bilaterally, increased from recent study. Heart size and pulmonary vascularity are normal. No adenopathy. No bone lesions. IMPRESSION: Multifocal airspace opacity, increased from most recent study. Suspect atypical organism pneumonia. Cardiac silhouette normal. No adenopathy. Electronically Signed  By: Bretta BangWilliam  Woodruff III M.D.   On: 05/11/2020 16:09   CT Angio Chest PE W and/or Wo Contrast  Result Date: 05/12/2020 CLINICAL DATA:  Recent COVID.  Hypoxia, high probability PE EXAM: CT ANGIOGRAPHY CHEST WITH CONTRAST TECHNIQUE: Multidetector CT imaging of the chest was performed using the standard protocol during bolus administration of intravenous contrast. Multiplanar CT image reconstructions and MIPs were obtained to evaluate the vascular anatomy. CONTRAST:  60mL OMNIPAQUE IOHEXOL 350 MG/ML SOLN COMPARISON:  CT 06/25/2017 FINDINGS: Cardiovascular: Heart size normal. No pericardial effusion. Satisfactory opacification of pulmonary arteries noted, and there is no evidence of pulmonary emboli. Adequate contrast opacification of the thoracic aorta with no evidence of dissection, aneurysm, or stenosis. There is classic 3-vessel brachiocephalic arch anatomy without proximal stenosis. Mediastinum/Nodes: No hilar or mediastinal adenopathy. Lungs/Pleura: No pleural effusion. No pneumothorax. Coarse peripheral airspace opacities throughout both lungs with some associated interstitial thickening, new since previous. Upper Abdomen: No acute findings Musculoskeletal: No chest wall abnormality. No acute or significant osseous findings. Review of the MIP images confirms the above findings. IMPRESSION: 1.  Negative for acute PE or thoracic aortic dissection. 2. Extensive pulmonary parenchymal changes consistent with organizing pneumonia, likely subacute COVID. Electronically Signed   By: Corlis Leak  Hassell M.D.   On: 05/12/2020 15:58   DG Chest Portable 1 View  Result Date: 04/30/2020 CLINICAL DATA:  Cough. COVID positive. EXAM: PORTABLE CHEST 1 VIEW COMPARISON:  None. FINDINGS: Very mild, hazy atelectasis and/or early infiltrate is seen within the bilateral lung bases. There is no evidence of a pleural effusion or pneumothorax. The heart size and mediastinal contours are within normal limits. The visualized skeletal structures are unremarkable. IMPRESSION: Very mild, hazy bibasilar atelectasis and/or early infiltrate. Electronically Signed   By: Aram Candelahaddeus  Houston M.D.   On: 04/30/2020 22:37   VAS US LOWER EXTREMITY VENOUS (DVT)  Result Date: 05/14/2020  Lower Venous DVTStudy Indications: COVID-19 positive. D-dimer=14.68.  Comparison Study: No prior study Performing Technologist: Gertie FeyMichelle Simonetti MHA, RDMS, RVT, RDCS  Examination Guidelines: A complete evaluation includes B-mode imaging, spectral Doppler, color Doppler, and power Doppler as needed of all accessible portions of each vessel. Bilateral testing is considered an integral part of a complete examination. Limited examinations for reoccurring indications may be performed as noted. The reflux portion of the exam is performed with the patient in reverse Trendelenburg.  +---------+---------------+---------+-----------+----------+--------------+ RIGHT    CompressibilityPhasicitySpontaneityPropertiesThrombus Aging +---------+---------------+---------+-----------+----------+--------------+ CFV      Full           Yes      Yes                                 +---------+---------------+---------+-----------+----------+--------------+ SFJ      Full                                                         +---------+---------------+---------+-----------+----------+--------------+ FV Prox  Full                                                        +---------+---------------+---------+-----------+----------+--------------+ FV Mid   Full                                                        +---------+---------------+---------+-----------+----------+--------------+  FV DistalFull                                                        +---------+---------------+---------+-----------+----------+--------------+ PFV      Full                                                        +---------+---------------+---------+-----------+----------+--------------+ POP      Full           Yes      Yes                                 +---------+---------------+---------+-----------+----------+--------------+ PTV      Full                                                        +---------+---------------+---------+-----------+----------+--------------+ PERO     Full                                                        +---------+---------------+---------+-----------+----------+--------------+   +---------+---------------+---------+-----------+----------+--------------+ LEFT     CompressibilityPhasicitySpontaneityPropertiesThrombus Aging +---------+---------------+---------+-----------+----------+--------------+ CFV      Full           Yes      Yes                                 +---------+---------------+---------+-----------+----------+--------------+ SFJ      Full                                                        +---------+---------------+---------+-----------+----------+--------------+ FV Prox  Full                                                        +---------+---------------+---------+-----------+----------+--------------+ FV Mid   Full                                                         +---------+---------------+---------+-----------+----------+--------------+ FV DistalFull                                                        +---------+---------------+---------+-----------+----------+--------------+  PFV      Full                                                        +---------+---------------+---------+-----------+----------+--------------+ POP      Full           Yes      Yes                                 +---------+---------------+---------+-----------+----------+--------------+ PTV      Full                                                        +---------+---------------+---------+-----------+----------+--------------+ PERO     Full                                                        +---------+---------------+---------+-----------+----------+--------------+     Summary: RIGHT: - There is no evidence of deep vein thrombosis in the lower extremity.  - No cystic structure found in the popliteal fossa.  LEFT: - There is no evidence of deep vein thrombosis in the lower extremity.  - No cystic structure found in the popliteal fossa.  *See table(s) above for measurements and observations. Electronically signed by Coral Else MD on 05/14/2020 at 1:08:40 PM.    Final

## 2020-05-15 NOTE — Progress Notes (Signed)
ANTICOAGULATION Note  Medications:  Lovenox 80 mg q12h  Patient on Lovenox q12h for empircally due to high d-dimer in the setting of COVID PNA with plan to continue full dose until d-dimer <2. Today patient's d-dimer remains elevated at 6.85. Patient has declined her second dose of Lovenox the last 2 days per RN, stating "I only take that medication once per day." Patient has received education on medication and dosing but still declines. Dr. Thedore Mins has been made aware and we will continue to monitor at this time.    Trixie Rude, PharmD PGY1 Acute Care Pharmacy Resident  05/15/2020 9:39 AM  Please check [email or web address] for unit-specific pharmacy phone numbers.

## 2020-05-16 LAB — COMPREHENSIVE METABOLIC PANEL
ALT: 53 U/L — ABNORMAL HIGH (ref 0–44)
AST: 27 U/L (ref 15–41)
Albumin: 2.5 g/dL — ABNORMAL LOW (ref 3.5–5.0)
Alkaline Phosphatase: 74 U/L (ref 38–126)
Anion gap: 8 (ref 5–15)
BUN: 14 mg/dL (ref 6–20)
CO2: 26 mmol/L (ref 22–32)
Calcium: 8.3 mg/dL — ABNORMAL LOW (ref 8.9–10.3)
Chloride: 106 mmol/L (ref 98–111)
Creatinine, Ser: 0.73 mg/dL (ref 0.44–1.00)
GFR calc Af Amer: >60 mL/min (ref >60)
GFR calc non Af Amer: >60 mL/min (ref >60)
Glucose, Bld: 102 mg/dL — ABNORMAL HIGH (ref 70–99)
Potassium: 4.2 mmol/L (ref 3.5–5.1)
Sodium: 140 mmol/L (ref 135–145)
Total Bilirubin: 0.3 mg/dL (ref 0.3–1.2)
Total Protein: 6 g/dL — ABNORMAL LOW (ref 6.5–8.1)

## 2020-05-16 LAB — D-DIMER, QUANTITATIVE: D-Dimer, Quant: 3.14 ug/mL-FEU — ABNORMAL HIGH (ref 0.00–0.50)

## 2020-05-16 LAB — CBC WITH DIFFERENTIAL/PLATELET
Abs Immature Granulocytes: 0.14 10*3/uL — ABNORMAL HIGH (ref 0.00–0.07)
Basophils Absolute: 0 10*3/uL (ref 0.0–0.1)
Basophils Relative: 0 %
Eosinophils Absolute: 0.1 10*3/uL (ref 0.0–0.5)
Eosinophils Relative: 1 %
HCT: 38.2 % (ref 36.0–46.0)
Hemoglobin: 11.8 g/dL — ABNORMAL LOW (ref 12.0–15.0)
Immature Granulocytes: 1 %
Lymphocytes Relative: 28 %
Lymphs Abs: 4.5 10*3/uL — ABNORMAL HIGH (ref 0.7–4.0)
MCH: 26.3 pg (ref 26.0–34.0)
MCHC: 30.9 g/dL (ref 30.0–36.0)
MCV: 85.3 fL (ref 80.0–100.0)
Monocytes Absolute: 1.2 10*3/uL — ABNORMAL HIGH (ref 0.1–1.0)
Monocytes Relative: 8 %
Neutro Abs: 10 10*3/uL — ABNORMAL HIGH (ref 1.7–7.7)
Neutrophils Relative %: 62 %
Platelets: 477 10*3/uL — ABNORMAL HIGH (ref 150–400)
RBC: 4.48 MIL/uL (ref 3.87–5.11)
RDW: 12.7 % (ref 11.5–15.5)
WBC: 16 10*3/uL — ABNORMAL HIGH (ref 4.0–10.5)
nRBC: 0 % (ref 0.0–0.2)

## 2020-05-16 LAB — GLUCOSE, CAPILLARY: Glucose-Capillary: 96 mg/dL (ref 70–99)

## 2020-05-16 LAB — MAGNESIUM: Magnesium: 2.3 mg/dL (ref 1.7–2.4)

## 2020-05-16 LAB — C-REACTIVE PROTEIN: CRP: 1.6 mg/dL — ABNORMAL HIGH (ref <1.0)

## 2020-05-16 LAB — BRAIN NATRIURETIC PEPTIDE: B Natriuretic Peptide: 47.3 pg/mL (ref 0.0–100.0)

## 2020-05-16 MED ORDER — ALBUTEROL SULFATE HFA 108 (90 BASE) MCG/ACT IN AERS
2.0000 | INHALATION_SPRAY | Freq: Four times a day (QID) | RESPIRATORY_TRACT | 0 refills | Status: AC | PRN
Start: 2020-05-16 — End: ?

## 2020-05-16 MED ORDER — APIXABAN 2.5 MG PO TABS: 2.5000 mg | ORAL_TABLET | Freq: Two times a day (BID) | ORAL | 0 refills | Status: AC

## 2020-05-16 MED ORDER — METHYLPREDNISOLONE 4 MG PO TBPK: ORAL_TABLET | ORAL | 0 refills | Status: DC

## 2020-05-16 NOTE — Discharge Instructions (Signed)
Follow with Primary MD  in 7 days   Get CBC, CMP, 2 view Chest X ray -  checked next visit within 1 week by Primary MD   Activity: As tolerated with Full fall precautions use walker/cane & assistance as needed  Disposition Home    Diet: Heart Healthy    Special Instructions: If you have smoked or chewed Tobacco  in the last 2 yrs please stop smoking, stop any regular Alcohol  and or any Recreational drug use.   On your next visit with your primary care physician please Get Medicines reviewed and adjusted.  Please request your Prim.MD to go over all Hospital Tests and Procedure/Radiological results at the follow up, please get all Hospital records sent to your Prim MD by signing hospital release before you go home.  If you experience worsening of your admission symptoms, develop shortness of breath, life threatening emergency, suicidal or homicidal thoughts you must seek medical attention immediately by calling 911 or calling your MD immediately  if symptoms less severe.  You Must read complete instructions/literature along with all the possible adverse reactions/side effects for all the Medicines you take and that have been prescribed to you. Take any new Medicines after you have completely understood and accpet all the possible adverse reactions/side effects.       Person Under Monitoring Name: Angela Duran  Location: [address] [place] [address]   Infection Prevention Recommendations for Individuals Confirmed to have, or Being Evaluated for, 2019 Novel Coronavirus (COVID-19) Infection Who Receive Care at Home  Individuals who are confirmed to have, or are being evaluated for, COVID-19 should follow the prevention steps below until a healthcare provider or local or state health department says they can return to normal activities.  Stay home except to get medical care You should restrict activities outside your home, except for getting medical care. Do not go to  work, school, or public areas, and do not use public transportation or taxis.  Call ahead before visiting your doctor Before your medical appointment, call the healthcare provider and tell them that you have, or are being evaluated for, COVID-19 infection. This will help the healthcare providers office take steps to keep other people from getting infected. Ask your healthcare provider to call the local or state health department.  Monitor your symptoms Seek prompt medical attention if your illness is worsening (e.g., difficulty breathing). Before going to your medical appointment, call the healthcare provider and tell them that you have, or are being evaluated for, COVID-19 infection. Ask your healthcare provider to call the local or state health department.  Wear a facemask You should wear a facemask that covers your nose and mouth when you are in the same room with other people and when you visit a healthcare provider. People who live with or visit you should also wear a facemask while they are in the same room with you.  Separate yourself from other people in your home As much as possible, you should stay in a different room from other people in your home. Also, you should use a separate bathroom, if available.  Avoid sharing household items You should not share dishes, drinking glasses, cups, eating utensils, towels, bedding, or other items with other people in your home. After using these items, you should wash them thoroughly with soap and water.  Cover your coughs and sneezes Cover your mouth and nose with a tissue when you cough or sneeze, or you can cough or sneeze into your  sleeve. Throw used tissues in a lined trash can, and immediately wash your hands with soap and water for at least 20 seconds or use an alcohol-based hand rub.  Wash your [employer] your hands often and thoroughly with soap and water for at least 20 seconds. You can use an alcohol-based hand sanitizer if  soap and water are not available and if your hands are not visibly dirty. Avoid touching your eyes, nose, and mouth with unwashed hands.   Prevention Steps for Caregivers and Household Members of Individuals Confirmed to have, or Being Evaluated for, COVID-19 Infection Being Cared for in the Home  If you live with, or provide care at home for, a person confirmed to have, or being evaluated for, COVID-19 infection please follow these guidelines to prevent infection:  Follow healthcare providers instructions Make sure that you understand and can help the patient follow any healthcare provider instructions for all care.  Provide for the patients basic needs You should help the patient with basic needs in the home and provide support for getting groceries, prescriptions, and other personal needs.  Monitor the patients symptoms If they are getting sicker, call his or her medical provider and tell them that the patient has, or is being evaluated for, COVID-19 infection. This will help the healthcare providers office take steps to keep other people from getting infected. Ask the healthcare provider to call the local or state health department.  Limit the number of people who have contact with the patient  If possible, have only one caregiver for the patient.  Other household members should stay in another home or place of residence. If this is not possible, they should stay  in another room, or be separated from the patient as much as possible. Use a separate bathroom, if available.  Restrict visitors who do not have an essential need to be in the home.  Keep older adults, very young children, and other sick people away from the patient Keep older adults, very young children, and those who have compromised immune systems or chronic health conditions away from the patient. This includes people with chronic heart, lung, or kidney conditions, diabetes, and cancer.  Ensure good  ventilation Make sure that shared spaces in the home have good air flow, such as from an air conditioner or an opened window, weather permitting.  Wash your hands often  Wash your hands often and thoroughly with soap and water for at least 20 seconds. You can use an alcohol based hand sanitizer if soap and water are not available and if your hands are not visibly dirty.  Avoid touching your eyes, nose, and mouth with unwashed hands.  Use disposable paper towels to dry your hands. If not available, use dedicated cloth towels and replace them when they become wet.  Wear a facemask and gloves  Wear a disposable facemask at all times in the room and gloves when you touch or have contact with the patients blood, body fluids, and/or secretions or excretions, such as sweat, saliva, sputum, nasal mucus, vomit, urine, or feces.  Ensure the mask fits over your nose and mouth tightly, and do not touch it during use.  Throw out disposable facemasks and gloves after using them. Do not reuse.  Wash your hands immediately after removing your facemask and gloves.  If your personal clothing becomes contaminated, carefully remove clothing and launder. Wash your hands after handling contaminated clothing.  Place all used disposable facemasks, gloves, and other waste in a lined  container before disposing them with other household waste.  Remove gloves and wash your hands immediately after handling these items.  Do not share dishes, glasses, or other household items with the patient  Avoid sharing household items. You should not share dishes, drinking glasses, cups, eating utensils, towels, bedding, or other items with a patient who is confirmed to have, or being evaluated for, COVID-19 infection.  After the person uses these items, you should wash them thoroughly with soap and water.  Wash laundry thoroughly  Immediately remove and wash clothes or bedding that have blood, body fluids, and/or  secretions or excretions, such as sweat, saliva, sputum, nasal mucus, vomit, urine, or feces, on them.  Wear gloves when handling laundry from the patient.  Read and follow directions on labels of laundry or clothing items and detergent. In general, wash and dry with the warmest temperatures recommended on the label.  Clean all areas the individual has used often  Clean all touchable surfaces, such as counters, tabletops, doorknobs, bathroom fixtures, toilets, phones, keyboards, tablets, and bedside tables, every day. Also, clean any surfaces that may have blood, body fluids, and/or secretions or excretions on them.  Wear gloves when cleaning surfaces the patient has come in contact with.  Use a diluted bleach solution (e.g., dilute bleach with 1 part bleach and 10 parts water) or a household disinfectant with a label that says EPA-registered for coronaviruses. To make a bleach solution at home, add 1 tablespoon of bleach to 1 quart (4 cups) of water. For a larger supply, add  cup of bleach to 1 gallon (16 cups) of water.  Read labels of cleaning products and follow recommendations provided on product labels. Labels contain instructions for safe and effective use of the cleaning product including precautions you should take when applying the product, such as wearing gloves or eye protection and making sure you have good ventilation during use of the product.  Remove gloves and wash hands immediately after cleaning.  Monitor yourself for signs and symptoms of illness Caregivers and household members are considered close contacts, should monitor their health, and will be asked to limit movement outside of the home to the extent possible. Follow the monitoring steps for close contacts listed on the symptom monitoring form.   ? If you have additional questions, contact your local health department or call the epidemiologist on call at [phone] (available 24/7). ? This guidance is subject  to change. For the most up-to-date guidance from [facility], please refer to their website: [email or web address].hu    ================================================================================================  Information on my medicine - ELIQUIS (apixaban)   Why was Eliquis prescribed for you? Eliquis was prescribed for you to reduce the risk of forming blood clots due to inflammation from recent COVID infection.  What do You need to know about Eliquis ? Take your Eliquis TWICE DAILY - one tablet in the morning and one tablet in the evening with or without food.  It would be best to take the doses about the same time each day.  If you have difficulty swallowing the tablet whole please discuss with your pharmacist how to take the medication safely.  Take Eliquis exactly as prescribed by your doctor and DO NOT stop taking Eliquis without talking to the doctor who prescribed the medication.  Stopping may increase your risk of developing a new clot or stroke.  Refill your prescription before you run out.  After discharge, you should have regular check-up appointments with your healthcare provider that is prescribing your  Eliquis.  In the future your dose may need to be changed if your kidney function or weight changes by a significant amount or as you get older.  What do you do if you miss a dose? If you miss a dose, take it as soon as you remember on the same day and resume taking twice daily.  Do not take more than one dose of ELIQUIS at the same time.  Important Safety Information A possible side effect of Eliquis is bleeding. You should call your healthcare provider right away if you experience any of the following: ? Bleeding from an injury or your nose that does not stop. ? Unusual colored urine (red or dark brown) or unusual colored stools (red or black). ? Unusual bruising for unknown reasons. ? A serious fall or if you hit  your head (even if there is no bleeding).  Some medicines may interact with Eliquis and might increase your risk of bleeding or clotting while on Eliquis. To help avoid this, consult your healthcare provider or pharmacist prior to using any new prescription or non-prescription medications, including herbals, vitamins, non-steroidal anti-inflammatory drugs (NSAIDs) and supplements.  This website has more information on Eliquis (apixaban): [email or web address].

## 2020-05-16 NOTE — Discharge Summary (Signed)
Angela Duran:[id number] DOB: [birth year] DOA: 05/11/2020  PCP: Patient, No Pcp Per  Admit date: 05/11/2020  Discharge date: 05/16/2020  Admitted From: Home  Disposition:  Home   Recommendations for Outpatient Follow-up:   Follow up with PCP in 1-2 weeks  PCP Please obtain BMP/CBC, 2 view CXR in 1week,  (see Discharge instructions)   PCP Please follow up on the following pending results: Check CBC, CMP, D-dimer, 2 view chest x-ray in 7 to 10 days.   Home Health: None   Equipment/Devices: None  Consultations: None  Discharge Condition: Stable    CODE STATUS: Full    Diet Recommendation: Heart Healthy   Diet Order            Diet - low sodium heart healthy           Diet regular Room service appropriate? Yes; Fluid consistency: Thin  Diet effective now                  Chief Complaint  Patient presents with  . Shortness of Breath     Brief history of present illness from the day of admission and additional interim summary    Angela Duran a 59 y.o.femalewithno significant past medical history presents to emergency department for evaluation of low oxygen saturation, she was diagnosed with COVID-19 pneumonia with acute hypoxic respiratory failure and admitted to the hospital.                                                                 Hospital Course    1. Acute Hypoxic Resp. Failure due to Acute Covid 19 Viral Pneumonitis during the ongoing 2020 Covid 19 Pandemic - she fortunately is not vaccinated and has incurred moderate parenchymal lung injury due to COVID-19 pneumonia, she is placed on IV steroids and Remdesivir course, she has shown good improvement and now stable on room air, will be discharged home on Medrol Dosepak along with rescue inhaler with PCP follow-up in 1  week.    Recent Labs  Lab 05/12/20 1115 05/12/20 1230 05/12/20 1422 05/13/20 0807 05/13/20 1000 05/14/20 1837 05/15/20 0442 05/16/20 0500  WBC  --   --  8.7 18.9*  --  20.8* 13.4* 16.0*  CRP  --  2.4*  --  1.2*  --  0.9 1.2* 1.6*  DDIMER  --  14.68*  --   --  11.39* 7.20* 6.85* 3.14*  BNP  --   --  36.5 59.8  --  110.0* 62.8 47.3  PROCALCITON  --  0.14  --   --   --   --   --   --   LATICACIDVEN 1.3  --   --   --   --   --   --   --   AST  --  53*  --  53*  --  27 35 27  ALT  --  66*  --  67*  --  57* 51* 53*  ALKPHOS  --  94  --  87  --  78 70 74  BILITOT  --  0.9  --  1.2  --  0.6 0.7 0.3  ALBUMIN  --  2.8*  --  2.8*  --  3.2* 2.5* 2.5*       2.  Elevated D-dimer due to intense inflammation.  Negative CT angiogram and leg ultrasound, noncompliant with full dose Lovenox however after much counseling took 2 doses yesterday, will get 2 more weeks of prophylactic dose Eliquis as her risk of getting a blood clot is very high due to intense inflammation from Covid, PCP to monitor.  3.  Hypertension.    Stable PCP to monitor, blood pressure might come down once she is off of steroids.  4.  Mild asymptomatic transaminitis due to COVID-19 viral infection.  Trend is stable PCP to recheck in 7 to 10 days.     Discharge diagnosis     Principal Problem:   Pneumonia due to COVID-19 virus Active Problems:   Elevated LFTs   Elevated d-dimer    Discharge instructions    Discharge Instructions    Diet - low sodium heart healthy   Complete by: As directed    Discharge instructions   Complete by: As directed    Follow with Primary MD  in 7 days   Get CBC, CMP, 2 view Chest X ray -  checked next visit within 1 week by Primary MD   Activity: As tolerated with Full fall precautions use walker/cane & assistance as needed  Disposition Home    Diet: Heart Healthy    Special Instructions: If you have smoked or chewed Tobacco  in the last 2 yrs please stop smoking, stop  any regular Alcohol  and or any Recreational drug use.  On your next visit with your primary care physician please Get Medicines reviewed and adjusted.  Please request your Prim.MD to go over all Hospital Tests and Procedure/Radiological results at the follow up, please get all Hospital records sent to your Prim MD by signing hospital release before you go home.  If you experience worsening of your admission symptoms, develop shortness of breath, life threatening emergency, suicidal or homicidal thoughts you must seek medical attention immediately by calling 911 or calling your MD immediately  if symptoms less severe.  You Must read complete instructions/literature along with all the possible adverse reactions/side effects for all the Medicines you take and that have been prescribed to you. Take any new Medicines after you have completely understood and accpet all the possible adverse reactions/side effects.   Increase activity slowly   Complete by: As directed       Discharge Medications   Allergies as of 05/16/2020   No Known Allergies     Medication List    STOP taking these medications   benzonatate 100 MG capsule Commonly known as: TESSALON   metoCLOPramide 10 MG tablet Commonly known as: Reglan     TAKE these medications   albuterol 108 (90 Base) MCG/ACT inhaler Commonly known as: VENTOLIN HFA Inhale 2 puffs into the lungs every 6 (six) hours as needed for wheezing or shortness of breath.   apixaban 2.5 MG Tabs tablet Commonly known as: Eliquis Take 1 tablet (2.5 mg total) by mouth 2 (two) times daily.   methylPREDNISolone 4  MG Tbpk tablet Commonly known as: MEDROL DOSEPAK follow package directions        Follow-up Information    [facility] Follow up.   Contact information: [address] [place] [address] [phone]       [facility]. Schedule an appointment as soon as possible for  a visit in 1 week(s).   Contact information: [address] [place] [address] [phone]              Major procedures and Radiology Reports - PLEASE review detailed and final reports thoroughly  -        DG Chest 1 View  Result Date: 05/11/2020 CLINICAL DATA:  Decreased oxygen saturation EXAM: CHEST  1 VIEW COMPARISON:  April 30, 2020 FINDINGS: Ill-defined airspace opacity is noted throughout the lungs bilaterally, increased from recent study. Heart size and pulmonary vascularity are normal. No adenopathy. No bone lesions. IMPRESSION: Multifocal airspace opacity, increased from most recent study. Suspect atypical organism pneumonia. Cardiac silhouette normal. No adenopathy. Electronically Signed   By: Bretta Bang III M.D.   On: 05/11/2020 16:09   CT Angio Chest PE W and/or Wo Contrast  Result Date: 05/12/2020 CLINICAL DATA:  Recent COVID.  Hypoxia, high probability PE EXAM: CT ANGIOGRAPHY CHEST WITH CONTRAST TECHNIQUE: Multidetector CT imaging of the chest was performed using the standard protocol during bolus administration of intravenous contrast. Multiplanar CT image reconstructions and MIPs were obtained to evaluate the vascular anatomy. CONTRAST:  67mL OMNIPAQUE IOHEXOL 350 MG/ML SOLN COMPARISON:  CT 06/25/2017 FINDINGS: Cardiovascular: Heart size normal. No pericardial effusion. Satisfactory opacification of pulmonary arteries noted, and there is no evidence of pulmonary emboli. Adequate contrast opacification of the thoracic aorta with no evidence of dissection, aneurysm, or stenosis. There is classic 3-vessel brachiocephalic arch anatomy without proximal stenosis. Mediastinum/Nodes: No hilar or mediastinal adenopathy. Lungs/Pleura: No pleural effusion. No pneumothorax. Coarse peripheral airspace opacities throughout both lungs with some associated interstitial thickening, new since previous. Upper Abdomen: No acute findings Musculoskeletal: No chest  wall abnormality. No acute or significant osseous findings. Review of the MIP images confirms the above findings. IMPRESSION: 1. Negative for acute PE or thoracic aortic dissection. 2. Extensive pulmonary parenchymal changes consistent with organizing pneumonia, likely subacute COVID. Electronically Signed   By: Corlis Leak M.D.   On: 05/12/2020 15:58   DG Chest Portable 1 View  Result Date: 04/30/2020 CLINICAL DATA:  Cough. COVID positive. EXAM: PORTABLE CHEST 1 VIEW COMPARISON:  None. FINDINGS: Very mild, hazy atelectasis and/or early infiltrate is seen within the bilateral lung bases. There is no evidence of a pleural effusion or pneumothorax. The heart size and mediastinal contours are within normal limits. The visualized skeletal structures are unremarkable. IMPRESSION: Very mild, hazy bibasilar atelectasis and/or early infiltrate. Electronically Signed   By: Aram Candela M.D.   On: 04/30/2020 22:37   VAS [place] LOWER EXTREMITY VENOUS (DVT)  Result Date: 05/14/2020  Lower Venous DVTStudy Indications: COVID-19 positive. D-dimer=14.68.  Comparison Study: No prior study Performing Technologist: Gertie Fey MHA, RDMS, RVT, RDCS  Examination Guidelines: A complete evaluation includes B-mode imaging, spectral Doppler, color Doppler, and power Doppler as needed of all accessible portions of each vessel. Bilateral testing is considered an integral part of a complete examination. Limited examinations for reoccurring indications may be performed as noted. The reflux portion of the exam is performed with the patient in reverse Trendelenburg.  +---------+---------------+---------+-----------+----------+--------------+ RIGHT    CompressibilityPhasicitySpontaneityPropertiesThrombus Aging +---------+---------------+---------+-----------+----------+--------------+  CFV      Full           Yes      Yes                                  +---------+---------------+---------+-----------+----------+--------------+ SFJ      Full                                                        +---------+---------------+---------+-----------+----------+--------------+ FV Prox  Full                                                        +---------+---------------+---------+-----------+----------+--------------+ FV Mid   Full                                                        +---------+---------------+---------+-----------+----------+--------------+ FV DistalFull                                                        +---------+---------------+---------+-----------+----------+--------------+ PFV      Full                                                        +---------+---------------+---------+-----------+----------+--------------+ POP      Full           Yes      Yes                                 +---------+---------------+---------+-----------+----------+--------------+ PTV      Full                                                        +---------+---------------+---------+-----------+----------+--------------+ PERO     Full                                                        +---------+---------------+---------+-----------+----------+--------------+   +---------+---------------+---------+-----------+----------+--------------+ LEFT     CompressibilityPhasicitySpontaneityPropertiesThrombus Aging +---------+---------------+---------+-----------+----------+--------------+ CFV      Full           Yes      Yes                                 +---------+---------------+---------+-----------+----------+--------------+  SFJ      Full                                                        +---------+---------------+---------+-----------+----------+--------------+ FV Prox  Full                                                         +---------+---------------+---------+-----------+----------+--------------+ FV Mid   Full                                                        +---------+---------------+---------+-----------+----------+--------------+ FV DistalFull                                                        +---------+---------------+---------+-----------+----------+--------------+ PFV      Full                                                        +---------+---------------+---------+-----------+----------+--------------+ POP      Full           Yes      Yes                                 +---------+---------------+---------+-----------+----------+--------------+ PTV      Full                                                        +---------+---------------+---------+-----------+----------+--------------+ PERO     Full                                                        +---------+---------------+---------+-----------+----------+--------------+     Summary: RIGHT: - There is no evidence of deep vein thrombosis in the lower extremity.  - No cystic structure found in the popliteal fossa.  LEFT: - There is no evidence of deep vein thrombosis in the lower extremity.  - No cystic structure found in the popliteal fossa.  *See table(s) above for measurements and observations. Electronically signed by Coral ElseVance Brabham MD on 05/14/2020 at 1:08:40 PM.    Final     Micro Results     Recent Results (from the past 240 hour(s))  Blood Culture (routine x 2)     Status: None (Preliminary result)   Collection Time:  05/12/20 11:15 AM   Specimen: BLOOD  Result Value Ref Range Status   Specimen Description BLOOD SITE NOT SPECIFIED  Final   Special Requests   Final    BOTTLES DRAWN AEROBIC AND ANAEROBIC Blood Culture adequate volume   Culture   Final    NO GROWTH 3 DAYS Performed at [facility], [address] [place], [place] [address]    Report Status PENDING  Incomplete  Blood  Culture (routine x 2)     Status: None (Preliminary result)   Collection Time: 05/12/20  3:51 PM   Specimen: BLOOD LEFT HAND  Result Value Ref Range Status   Specimen Description BLOOD LEFT HAND  Final   Special Requests   Final    BOTTLES DRAWN AEROBIC AND ANAEROBIC Blood Culture results may not be optimal due to an inadequate volume of blood received in culture bottles   Culture   Final    NO GROWTH 3 DAYS Performed at [facility], [address], [place], [place] [address]    Report Status PENDING  Incomplete    Today   Subjective    Yen Wandell today has no headache,no chest abdominal pain,no new weakness tingling or numbness, feels much better wants to go home today.     Objective   Blood pressure 137/81, pulse 70, temperature 98.1 F (36.7 C), temperature source Oral, resp. rate 18, height 5\' 4"  (1.626 m), weight 81.6 kg, SpO2 94 %.   Intake/Output Summary (Last 24 hours) at 05/16/2020 1016 Last data filed at 05/15/2020 1100 Gross per 24 hour  Intake 300 ml  Output --  Net 300 ml    Exam  Awake Alert, No new F.N deficits, Normal affect [place].AT,PERRAL Supple Neck,No JVD, No cervical lymphadenopathy appriciated.  Symmetrical Chest wall movement, Good air movement bilaterally, CTAB RRR,No Gallops,Rubs or new Murmurs, No Parasternal Heave +ve B.Sounds, Abd Soft, Non tender, No organomegaly appriciated, No rebound -guarding or rigidity. No Cyanosis, Clubbing or edema, No new Rash or bruise   Data Review   CBC w Diff:  Lab Results  Component Value Date   WBC 16.0 (H) 05/16/2020   HGB 11.8 (L) 05/16/2020   HCT 38.2 05/16/2020   PLT 477 (H) 05/16/2020   LYMPHOPCT 28 05/16/2020   MONOPCT 8 05/16/2020   EOSPCT 1 05/16/2020   BASOPCT 0 05/16/2020    CMP:  Lab Results  Component Value Date   NA 140 05/16/2020   K 4.2 05/16/2020   CL 106 05/16/2020   CO2 26 05/16/2020   BUN 14 05/16/2020   CREATININE 0.73 05/16/2020   PROT 6.0 (L) 05/16/2020    ALBUMIN 2.5 (L) 05/16/2020   BILITOT 0.3 05/16/2020   ALKPHOS 74 05/16/2020   AST 27 05/16/2020   ALT 53 (H) 05/16/2020  .   Total Time in preparing paper work, data evaluation and todays exam - 35 minutes  07/16/2020 M.D on 05/16/2020 at 10:16 AM  [facility]   Office  [phone]

## 2020-05-16 NOTE — Progress Notes (Signed)
                                      [facility]                            [address]                            [place], [place] [address]      Nyrie Sigal was admitted to the Hospital on 05/11/2020 and Discharged  05/16/2020 and should be excused from work/school   for 21  days starting from date -  04/30/2020 , may return to work/school without any restrictions.  Call Susa Raring MD, [facility]  [phone] with questions.  Susa Raring M.D on 05/16/2020,at 11:08 AM  [facility]   Office  [phone]

## 2020-05-17 LAB — CULTURE, BLOOD (ROUTINE X 2)
Culture: NO GROWTH
Culture: NO GROWTH
Special Requests: ADEQUATE

## 2020-05-18 ENCOUNTER — Telehealth: Payer: Self-pay | Admitting: General Practice

## 2020-05-18 NOTE — Telephone Encounter (Signed)
Pt was called to make appt per referral from [facility]. lvm

## 2020-05-26 ENCOUNTER — Ambulatory Visit (INDEPENDENT_AMBULATORY_CARE_PROVIDER_SITE_OTHER): Payer: 59 | Admitting: Nurse Practitioner

## 2020-05-26 VITALS — BP 122/78 | HR 97 | Temp 97.1°F | Wt 185.0 lb

## 2020-05-26 DIAGNOSIS — U071 COVID-19: Secondary | ICD-10-CM

## 2020-05-26 DIAGNOSIS — R7989 Other specified abnormal findings of blood chemistry: Secondary | ICD-10-CM

## 2020-05-26 DIAGNOSIS — J1282 Pneumonia due to coronavirus disease 2019: Secondary | ICD-10-CM | POA: Diagnosis not present

## 2020-05-26 NOTE — Patient Instructions (Signed)
Covid 19:   Stay well hydrated  Stay active  Deep breathing exercises  May start vitamin C 2,000 mg daily, vitamin D3 2,000 IU daily, Zinc 220 mg daily, and Quercetin 500 mg twice daily  May take tylenol or fever or pain  Will order repeat chest x ray  Will order repeat labs   Follow up:  Follow up as needed

## 2020-05-26 NOTE — Assessment & Plan Note (Signed)
Stay well hydrated  Stay active  Deep breathing exercises  May start vitamin C 2,000 mg daily, vitamin D3 2,000 IU daily, Zinc 220 mg daily, and Quercetin 500 mg twice daily  May take tylenol or fever or pain  Will order repeat chest x ray  Will order repeat labs   Follow up:  Follow up as needed

## 2020-05-26 NOTE — Progress Notes (Signed)
@Patient  ID: , female    DOB: March 14, 1961, 59 y.o.   MRN: [id number]  Chief Complaint  Patient presents with  . Hospitalization Follow-up    COVID Pos: 9/18 Hosp: 9/29-10/4 Sx: nigh sweats    Referring provider: No ref. provider found  59 year old female with no significant health history.  HPI  Patient presents today for post covid care center visit /hospital follow-up.  Patient was admitted to the hospital on 05/11/2020 through 05/16/2020.  She was admitted for acute hypoxic respiratory failure due to COVID pneumonia.  Patient was treated with remdesivir, IV steroids.  Patient was found to have elevated D-dimer which was trending down at hospital discharge.  She was placed on prophylactic Eliquis.  She has been compliant with this medication.  Patient states that overall she is doing well.  She denies any significant shortness of breath.  She does need repeat labs including D-dimer and follow-up chest x-ray today.  Patient has schedule an appointment to establish care with a new primary care provider in November.  Denies f/c/s, n/v/d, hemoptysis, PND, chest pain or edema.   No Known Allergies   There is no immunization history on file for this patient.  Past Medical History:  Diagnosis Date  . Prediabetes     Tobacco History: Social History   Tobacco Use  Smoking Status Never Smoker  Smokeless Tobacco Never Used   Counseling given: Not Answered   Outpatient Encounter Medications as of 05/26/2020  Medication Sig  . albuterol (VENTOLIN HFA) 108 (90 Base) MCG/ACT inhaler Inhale 2 puffs into the lungs every 6 (six) hours as needed for wheezing or shortness of breath.  05/28/2020 apixaban (ELIQUIS) 2.5 MG TABS tablet Take 1 tablet (2.5 mg total) by mouth 2 (two) times daily.  . [DISCONTINUED] methylPREDNISolone (MEDROL DOSEPAK) 4 MG TBPK tablet follow package directions   No facility-administered encounter medications on file as of 05/26/2020.     Review of  Systems  Review of Systems  Constitutional: Negative.  Negative for fever.  HENT: Negative.   Respiratory: Negative for cough and shortness of breath.   Cardiovascular: Negative.  Negative for chest pain and leg swelling.  Gastrointestinal: Negative.   Allergic/Immunologic: Negative.   Neurological: Negative.   Psychiatric/Behavioral: Negative.        Physical Exam  BP 122/78 (BP Location: Right Arm)   Pulse 97   Temp (!) 97.1 F (36.2 C)   Wt 185 lb (83.9 kg)   SpO2 94%   BMI 31.76 kg/m   Wt Readings from Last 5 Encounters:  05/26/20 185 lb (83.9 kg)  05/11/20 180 lb (81.6 kg)  04/30/20 180 lb (81.6 kg)  11/19/19 184 lb (83.5 kg)  06/25/17 187 lb (84.8 kg)     Physical Exam Vitals and nursing note reviewed.  Constitutional:      General: She is not in acute distress.    Appearance: She is well-developed.  Cardiovascular:     Rate and Rhythm: Normal rate and regular rhythm.  Pulmonary:     Effort: Pulmonary effort is normal.     Breath sounds: Normal breath sounds.  Musculoskeletal:     Right lower leg: No edema.     Left lower leg: No edema.  Neurological:     Mental Status: She is alert and oriented to person, place, and time.  Psychiatric:        Mood and Affect: Mood normal.        Behavior: Behavior normal.  Imaging: DG Chest 1 View  Result Date: 05/11/2020 CLINICAL DATA:  Decreased oxygen saturation EXAM: CHEST  1 VIEW COMPARISON:  April 30, 2020 FINDINGS: Ill-defined airspace opacity is noted throughout the lungs bilaterally, increased from recent study. Heart size and pulmonary vascularity are normal. No adenopathy. No bone lesions. IMPRESSION: Multifocal airspace opacity, increased from most recent study. Suspect atypical organism pneumonia. Cardiac silhouette normal. No adenopathy. Electronically Signed   By: Bretta Bang III M.D.   On: 05/11/2020 16:09   CT Angio Chest PE W and/or Wo Contrast  Result Date: 05/12/2020 CLINICAL  DATA:  Recent COVID.  Hypoxia, high probability PE EXAM: CT ANGIOGRAPHY CHEST WITH CONTRAST TECHNIQUE: Multidetector CT imaging of the chest was performed using the standard protocol during bolus administration of intravenous contrast. Multiplanar CT image reconstructions and MIPs were obtained to evaluate the vascular anatomy. CONTRAST:  32mL OMNIPAQUE IOHEXOL 350 MG/ML SOLN COMPARISON:  CT 06/25/2017 FINDINGS: Cardiovascular: Heart size normal. No pericardial effusion. Satisfactory opacification of pulmonary arteries noted, and there is no evidence of pulmonary emboli. Adequate contrast opacification of the thoracic aorta with no evidence of dissection, aneurysm, or stenosis. There is classic 3-vessel brachiocephalic arch anatomy without proximal stenosis. Mediastinum/Nodes: No hilar or mediastinal adenopathy. Lungs/Pleura: No pleural effusion. No pneumothorax. Coarse peripheral airspace opacities throughout both lungs with some associated interstitial thickening, new since previous. Upper Abdomen: No acute findings Musculoskeletal: No chest wall abnormality. No acute or significant osseous findings. Review of the MIP images confirms the above findings. IMPRESSION: 1. Negative for acute PE or thoracic aortic dissection. 2. Extensive pulmonary parenchymal changes consistent with organizing pneumonia, likely subacute COVID. Electronically Signed   By: Corlis Leak M.D.   On: 05/12/2020 15:58   DG Chest Portable 1 View  Result Date: 04/30/2020 CLINICAL DATA:  Cough. COVID positive. EXAM: PORTABLE CHEST 1 VIEW COMPARISON:  None. FINDINGS: Very mild, hazy atelectasis and/or early infiltrate is seen within the bilateral lung bases. There is no evidence of a pleural effusion or pneumothorax. The heart size and mediastinal contours are within normal limits. The visualized skeletal structures are unremarkable. IMPRESSION: Very mild, hazy bibasilar atelectasis and/or early infiltrate. Electronically Signed   By: Aram Candela M.D.   On: 04/30/2020 22:37   VAS [place] LOWER EXTREMITY VENOUS (DVT)  Result Date: 05/14/2020  Lower Venous DVTStudy Indications: COVID-19 positive. D-dimer=14.68.  Comparison Study: No prior study Performing Technologist: Gertie Fey MHA, RDMS, RVT, RDCS  Examination Guidelines: A complete evaluation includes B-mode imaging, spectral Doppler, color Doppler, and power Doppler as needed of all accessible portions of each vessel. Bilateral testing is considered an integral part of a complete examination. Limited examinations for reoccurring indications may be performed as noted. The reflux portion of the exam is performed with the patient in reverse Trendelenburg.  +---------+---------------+---------+-----------+----------+--------------+ RIGHT    CompressibilityPhasicitySpontaneityPropertiesThrombus Aging +---------+---------------+---------+-----------+----------+--------------+ CFV      Full           Yes      Yes                                 +---------+---------------+---------+-----------+----------+--------------+ SFJ      Full                                                        +---------+---------------+---------+-----------+----------+--------------+  FV Prox  Full                                                        +---------+---------------+---------+-----------+----------+--------------+ FV Mid   Full                                                        +---------+---------------+---------+-----------+----------+--------------+ FV DistalFull                                                        +---------+---------------+---------+-----------+----------+--------------+ PFV      Full                                                        +---------+---------------+---------+-----------+----------+--------------+ POP      Full           Yes      Yes                                  +---------+---------------+---------+-----------+----------+--------------+ PTV      Full                                                        +---------+---------------+---------+-----------+----------+--------------+ PERO     Full                                                        +---------+---------------+---------+-----------+----------+--------------+   +---------+---------------+---------+-----------+----------+--------------+ LEFT     CompressibilityPhasicitySpontaneityPropertiesThrombus Aging +---------+---------------+---------+-----------+----------+--------------+ CFV      Full           Yes      Yes                                 +---------+---------------+---------+-----------+----------+--------------+ SFJ      Full                                                        +---------+---------------+---------+-----------+----------+--------------+ FV Prox  Full                                                        +---------+---------------+---------+-----------+----------+--------------+  FV Mid   Full                                                        +---------+---------------+---------+-----------+----------+--------------+ FV DistalFull                                                        +---------+---------------+---------+-----------+----------+--------------+ PFV      Full                                                        +---------+---------------+---------+-----------+----------+--------------+ POP      Full           Yes      Yes                                 +---------+---------------+---------+-----------+----------+--------------+ PTV      Full                                                        +---------+---------------+---------+-----------+----------+--------------+ PERO     Full                                                         +---------+---------------+---------+-----------+----------+--------------+     Summary: RIGHT: - There is no evidence of deep vein thrombosis in the lower extremity.  - No cystic structure found in the popliteal fossa.  LEFT: - There is no evidence of deep vein thrombosis in the lower extremity.  - No cystic structure found in the popliteal fossa.  *See table(s) above for measurements and observations. Electronically signed by Coral Else MD on 05/14/2020 at 1:08:40 PM.    Final      Assessment & Plan:   Pneumonia due to COVID-19 virus Stay well hydrated  Stay active  Deep breathing exercises  May start vitamin C 2,000 mg daily, vitamin D3 2,000 IU daily, Zinc 220 mg daily, and Quercetin 500 mg twice daily  May take tylenol or fever or pain  Will order repeat chest x ray  Will order repeat labs   Follow up:  Follow up as needed     Ivonne Andrew, NP 05/26/2020

## 2021-05-05 IMAGING — CT CT ANGIO CHEST
2 of 6 series · 17 of 46 positions shown · IV contrast (omnipaque)
Comparison: CT 06/25/2017

CLINICAL DATA: Recent COVID.  Hypoxia, high probability PE

EXAM:
CT ANGIOGRAPHY CHEST WITH CONTRAST
TECHNIQUE: Multidetector CT imaging of the chest was performed using the
standard protocol during bolus administration of intravenous
contrast. Multiplanar CT image reconstructions and MIPs were
obtained to evaluate the vascular anatomy.
CONTRAST:  60mL OMNIPAQUE IOHEXOL 350 MG/ML SOLN

[Series 6: thins · axial · 0.72mm/px · z∈[+14,+208]mm · 14 of 214 slices shown]
[im 10/214  lung]
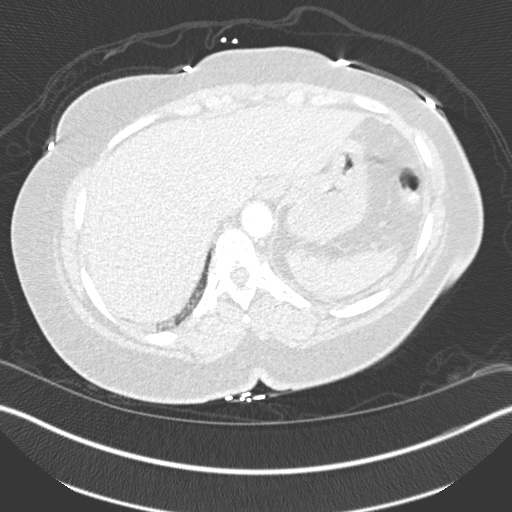
[im 28/214  soft-tissue]
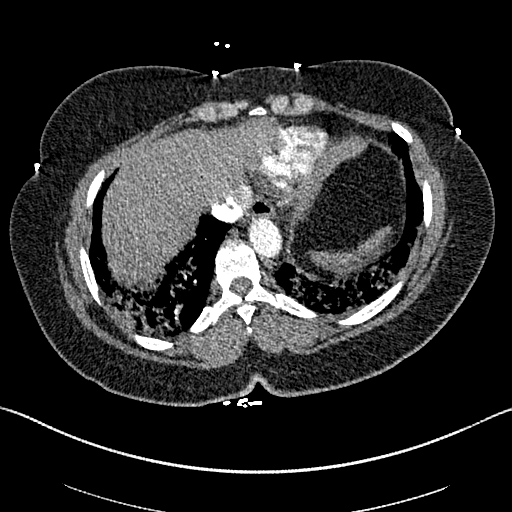
[im 38/214  lung]
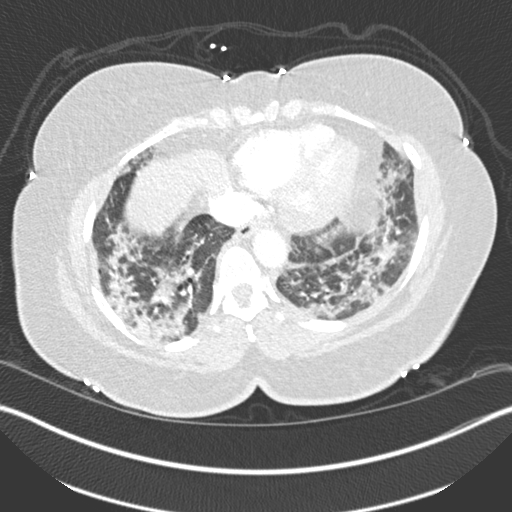
[im 56/214  soft-tissue]
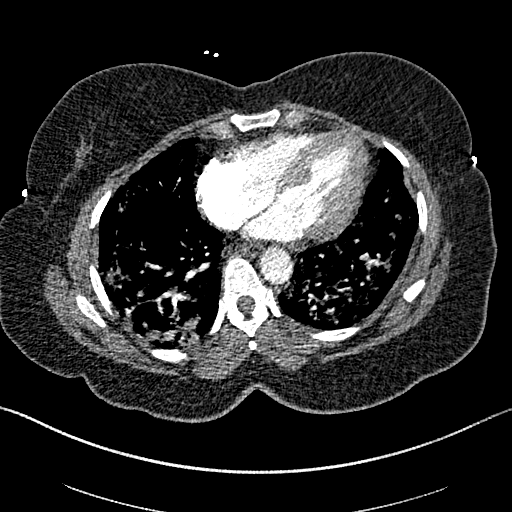
[im 75/214  lung]
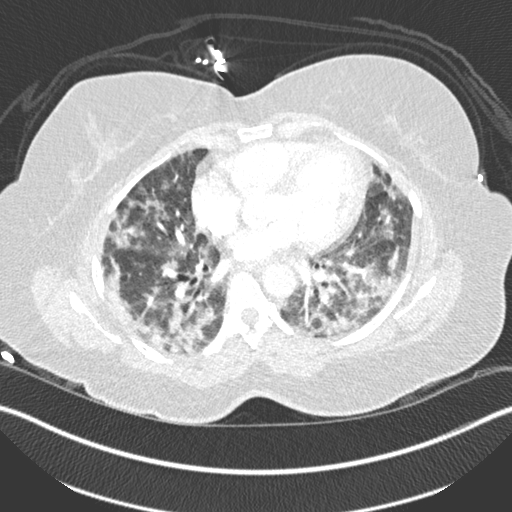
[im 84/214  soft-tissue]
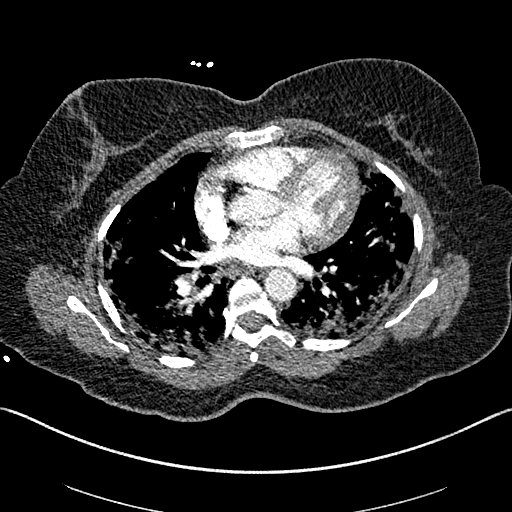
[im 102/214  lung]
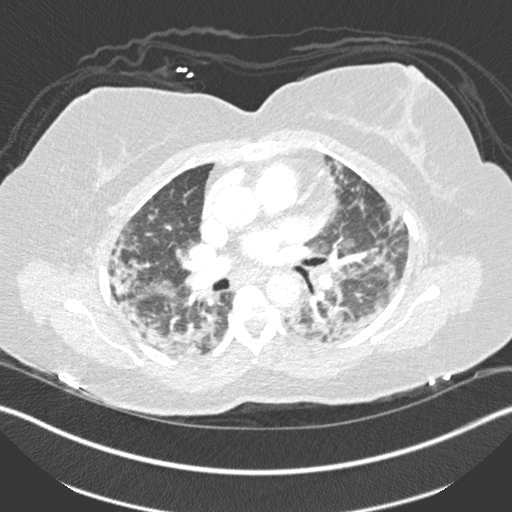
[im 112/214  soft-tissue]
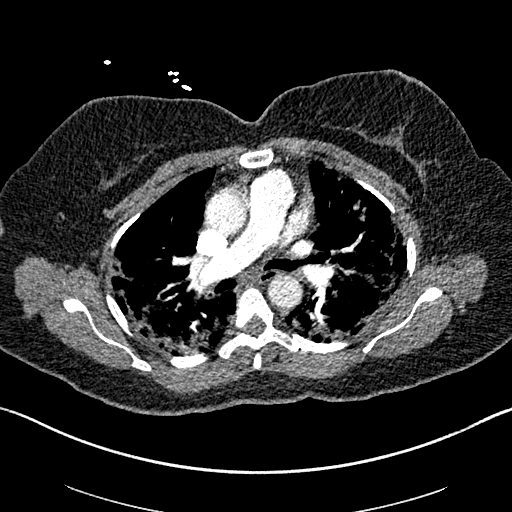
[im 130/214  lung]
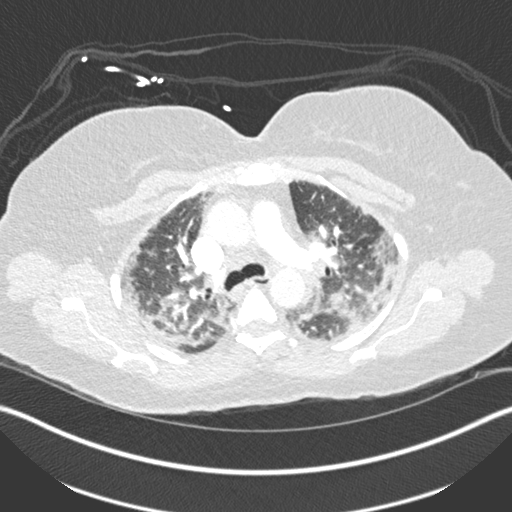
[im 139/214  soft-tissue]
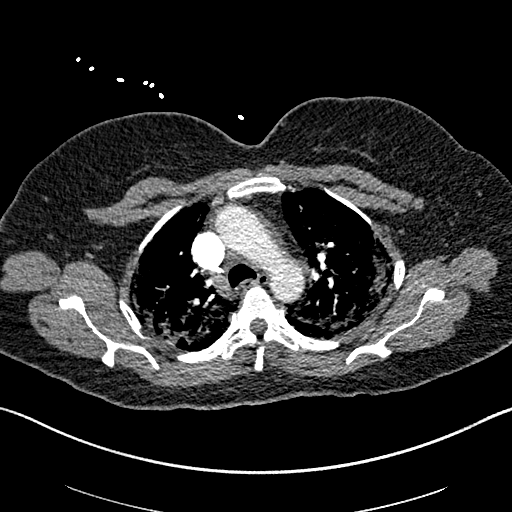
[im 158/214  lung]
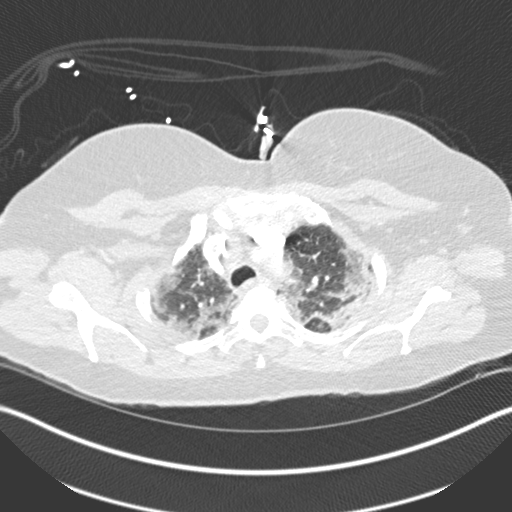
[im 176/214  soft-tissue]
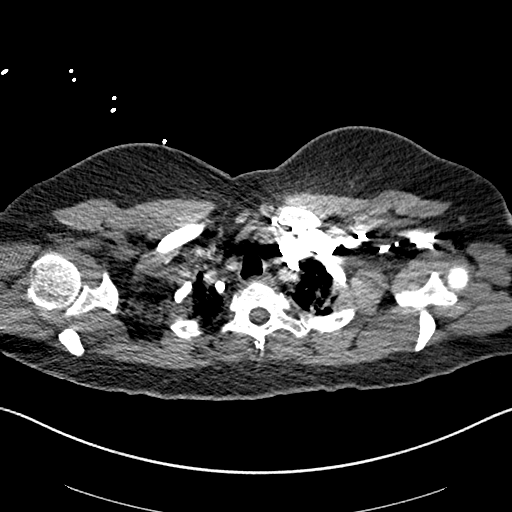
[im 186/214  lung]
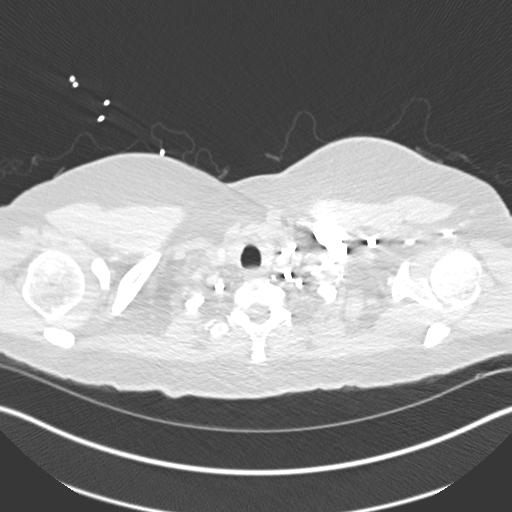
[im 204/214  soft-tissue]
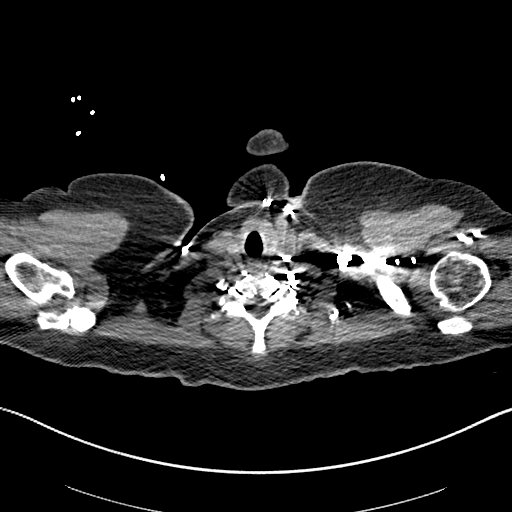

[Series 8: coronal mpr · coronal · 0.41mm/px · 3 of 149 slices shown]
[im 38/149  soft-tissue]
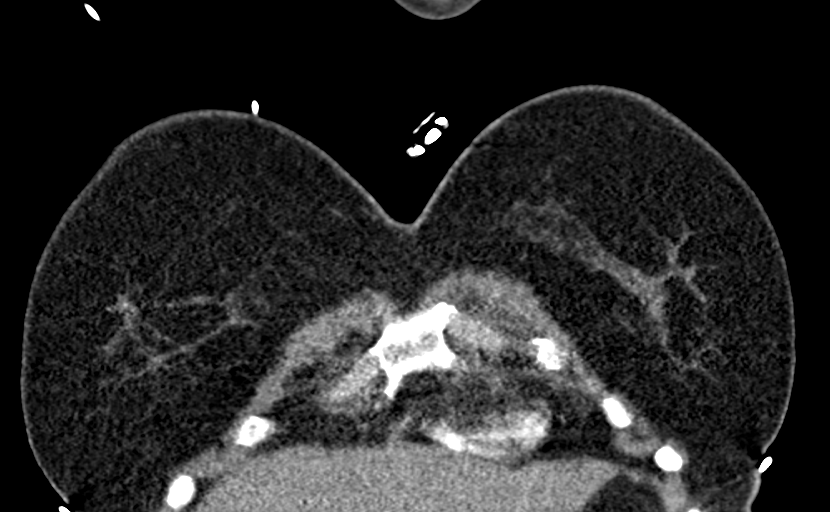
[im 75/149  soft-tissue]
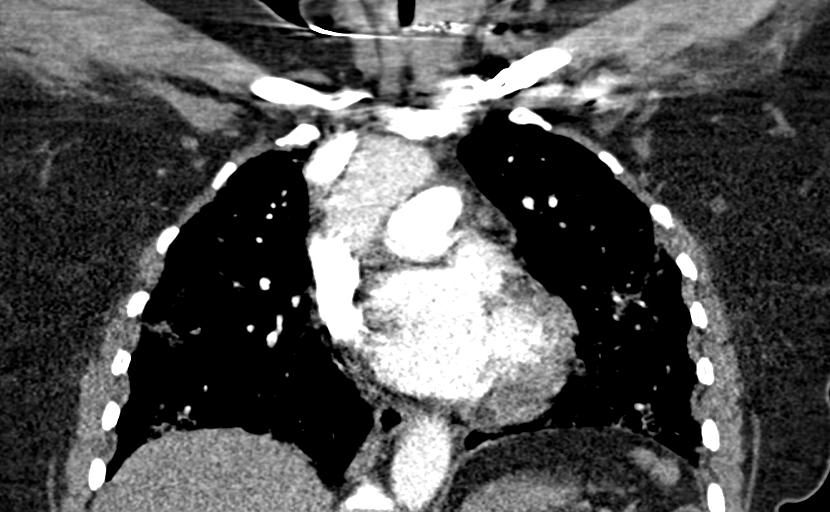
[im 112/149  soft-tissue]
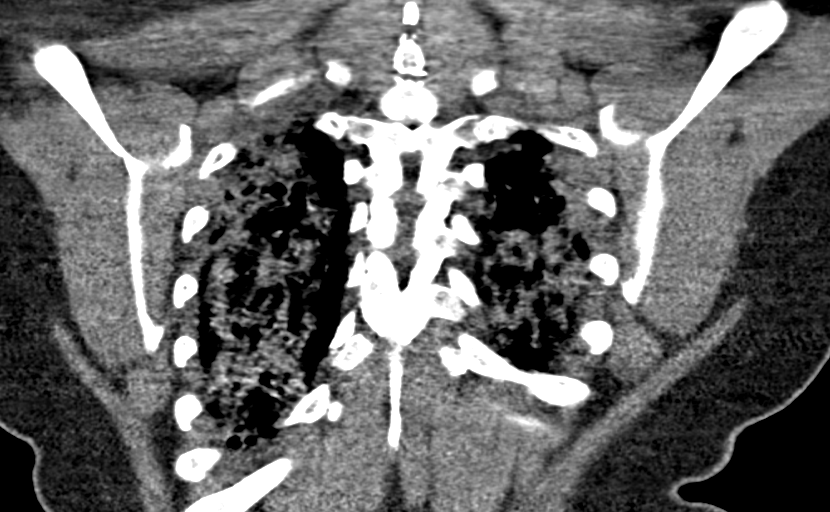

[17 of 46 positions shown; findings below may reference images not displayed]

FINDINGS: Cardiovascular: Heart size normal. No pericardial effusion.
Satisfactory opacification of pulmonary arteries noted, and there is
no evidence of pulmonary emboli. Adequate contrast opacification of
the thoracic aorta with no evidence of dissection, aneurysm, or
stenosis. There is classic 3-vessel brachiocephalic arch anatomy
without proximal stenosis.

Mediastinum/Nodes: No hilar or mediastinal adenopathy.

Lungs/Pleura: No pleural effusion. No pneumothorax. Coarse
peripheral airspace opacities throughout both lungs with some
associated interstitial thickening, new since previous.

Upper Abdomen: No acute findings

Musculoskeletal: No chest wall abnormality. No acute or significant
osseous findings.

Review of the MIP images confirms the above findings.
IMPRESSION: 1. Negative for acute PE or thoracic aortic dissection.
2. Extensive pulmonary parenchymal changes consistent with
organizing pneumonia, likely subacute COVID.

## 2021-05-23 ENCOUNTER — Ambulatory Visit: Payer: Self-pay | Attending: Oncology

## 2021-05-23 ENCOUNTER — Other Ambulatory Visit: Payer: Self-pay

## 2021-05-23 ENCOUNTER — Ambulatory Visit
Admission: RE | Admit: 2021-05-23 | Discharge: 2021-05-23 | Disposition: A | Discharge status: Home / Self Care | Payer: Self-pay | Source: Ambulatory Visit | Attending: Oncology | Admitting: Oncology

## 2021-05-23 DIAGNOSIS — Z0000 Encounter for general adult medical examination without abnormal findings: Secondary | ICD-10-CM

## 2021-05-23 NOTE — Progress Notes (Signed)
  Subjective:     Patient ID: Angela Duran, female   DOB: [birth year], 60 y.o.   MRN: [id number]  HPI  [facility] Medical History Record - 05/22/21 1410       Breast History   Screening cycle New    Provider ([facility]) none    Initial Mammogram 05/23/21    Last Mammogram Annual    Last Mammogram Date 11/19/19    Provider (Mammogram)  Delford Field    Recent Breast Symptoms Pain   bilateral upper breast discomfort     Breast Cancer History   Breast Cancer History No personal or family history    Comments/Details m 1st cousin      Previous History of Breast Problems   Breast Surgery or Biopsy None    Breast Implants N/A    BSE Done Monthly      Gynecological/Obstetrical History   LMP --   39   Is there any chance that the client could be pregnant?  No    Age at menarche 3    Age at menopause 53    Date of last PAP  11/19/19    Age at first live birth 60    Breast fed children No    DES Exposure No    Cervical, Uterine or Ovarian cancer No    Family history of Cervial, Uterine or Ovarian cancer No    Hysterectomy No    Cervix removed No    Ovaries removed No    Laser/Cryosurgery No    Current method of birth control None    Current method of Estrogen/Hormone replacement None    Smoking history None              Review of Systems     Objective:   Physical Exam Chest:  Breasts:    Right: No swelling, bleeding, inverted nipple, mass, nipple discharge, skin change or tenderness.     Left: No swelling, bleeding, inverted nipple, mass, nipple discharge, skin change or tenderness.       Assessment:     60 year old patient presents for [facility] clinic visit Patient screened, and meets [facility] eligibility.  Patient does not have insurance, Medicare or Medicaid.  Instructed patient on breast self awareness using teach back method Clinical breast exam unremarkable. No mass or lump palpated.   Risk Assessment     Risk Scores       05/23/2021 11/19/2019   Last edited by: Scarlett Presto, RN McGill, Fidel Levy, LPN   5-year risk: 1.4 % 1.4 %   Lifetime risk: 6.9 % 7.1 %               Plan:     Sent for bilateral screening mammogram.

## 2021-06-25 NOTE — Progress Notes (Signed)
Letter mailed from [facility] to notify of normal mammogram results.  Patient to return in one year for annual screening.  Copy to [facility].
